# Patient Record
Sex: Female | Born: 1965 | Race: Black or African American | Hispanic: No | Marital: Single | Smoking: Never smoker
Health system: Southern US, Community
[De-identification: ages and names within clinical notes are randomized; demographics above are authoritative.]

## PROBLEM LIST (undated history)

## (undated) DIAGNOSIS — E119 Type 2 diabetes mellitus without complications: Secondary | ICD-10-CM

## (undated) DIAGNOSIS — K219 Gastro-esophageal reflux disease without esophagitis: Secondary | ICD-10-CM

## (undated) DIAGNOSIS — E010 Iodine-deficiency related diffuse (endemic) goiter: Secondary | ICD-10-CM

## (undated) DIAGNOSIS — J45909 Unspecified asthma, uncomplicated: Secondary | ICD-10-CM

## (undated) DIAGNOSIS — I10 Essential (primary) hypertension: Secondary | ICD-10-CM

## (undated) HISTORY — DX: Unspecified asthma, uncomplicated: J45.909

## (undated) HISTORY — PX: OTHER SURGICAL HISTORY: SHX169

---

## 2001-12-22 ENCOUNTER — Emergency Department (HOSPITAL_COMMUNITY): Admission: EM | Admit: 2001-12-22 | Discharge: 2001-12-22 | Payer: Self-pay | Admitting: Emergency Medicine

## 2008-06-13 ENCOUNTER — Emergency Department (HOSPITAL_COMMUNITY): Admission: EM | Admit: 2008-06-13 | Discharge: 2008-06-13 | Payer: Self-pay | Admitting: Emergency Medicine

## 2010-06-26 LAB — POCT CARDIAC MARKERS
Myoglobin, poc: 31 ng/mL (ref 12–200)
Myoglobin, poc: 34.6 ng/mL (ref 12–200)

## 2010-06-26 LAB — DIFFERENTIAL
Basophils Absolute: 0.1 10*3/uL (ref 0.0–0.1)
Basophils Relative: 1 % (ref 0–1)
Eosinophils Absolute: 0.1 10*3/uL (ref 0.0–0.7)
Monocytes Relative: 9 % (ref 3–12)
Neutrophils Relative %: 51 % (ref 43–77)

## 2010-06-26 LAB — CBC
MCHC: 33.8 g/dL (ref 30.0–36.0)
MCV: 86.7 fL (ref 78.0–100.0)
Platelets: UNDETERMINED 10*3/uL (ref 150–400)
RBC: 4.21 MIL/uL (ref 3.87–5.11)
RDW: 13.8 % (ref 11.5–15.5)

## 2010-06-26 LAB — BASIC METABOLIC PANEL
BUN: 9 mg/dL (ref 6–23)
CO2: 26 mEq/L (ref 19–32)
Chloride: 102 mEq/L (ref 96–112)
Creatinine, Ser: 0.72 mg/dL (ref 0.4–1.2)
Glucose, Bld: 104 mg/dL — ABNORMAL HIGH (ref 70–99)

## 2013-08-30 ENCOUNTER — Ambulatory Visit: Payer: Self-pay | Admitting: Podiatry

## 2014-02-14 ENCOUNTER — Ambulatory Visit (INDEPENDENT_AMBULATORY_CARE_PROVIDER_SITE_OTHER): Payer: Federal, State, Local not specified - PPO | Admitting: Podiatry

## 2014-02-14 ENCOUNTER — Encounter: Payer: Self-pay | Admitting: Podiatry

## 2014-02-14 VITALS — BP 165/101 | HR 60 | Ht 69.0 in | Wt 200.0 lb

## 2014-02-14 DIAGNOSIS — M7741 Metatarsalgia, right foot: Secondary | ICD-10-CM

## 2014-02-14 DIAGNOSIS — M7742 Metatarsalgia, left foot: Secondary | ICD-10-CM

## 2014-02-14 DIAGNOSIS — M21969 Unspecified acquired deformity of unspecified lower leg: Secondary | ICD-10-CM | POA: Insufficient documentation

## 2014-02-14 DIAGNOSIS — M216X1 Other acquired deformities of right foot: Secondary | ICD-10-CM

## 2014-02-14 DIAGNOSIS — M216X2 Other acquired deformities of left foot: Secondary | ICD-10-CM

## 2014-02-14 NOTE — Patient Instructions (Signed)
Casted for orthotics. Will contact patient when they are ready. 

## 2014-02-14 NOTE — Progress Notes (Signed)
Subjective:  Last orthotics were done 4 years ago. Padding got thin and uncomfortable at work.  Walks on concrete floor. Stated that she noted her feet walking on side of foot.  Pain under 5th MPJ R>L.  Objective: Cavus type foot with rearfoot varus bilateral. Sub 5 right foot pain. Neurovascular status are within normal.  Assessment: Metatarsalgia 5th right. Pes Cavus bilateral. Rearfoot varus bilateral. Elevated first ray left/forefoot varus left.  Plan: Both feet casted for orthotics.

## 2014-05-07 ENCOUNTER — Encounter (HOSPITAL_COMMUNITY): Payer: Self-pay | Admitting: Family Medicine

## 2014-05-07 ENCOUNTER — Emergency Department (HOSPITAL_COMMUNITY)
Admission: EM | Admit: 2014-05-07 | Discharge: 2014-05-07 | Disposition: A | Discharge status: Home / Self Care | Payer: Worker's Compensation | Attending: Emergency Medicine | Admitting: Emergency Medicine

## 2014-05-07 DIAGNOSIS — I10 Essential (primary) hypertension: Secondary | ICD-10-CM | POA: Insufficient documentation

## 2014-05-07 DIAGNOSIS — Z79899 Other long term (current) drug therapy: Secondary | ICD-10-CM | POA: Insufficient documentation

## 2014-05-07 DIAGNOSIS — G44229 Chronic tension-type headache, not intractable: Secondary | ICD-10-CM | POA: Insufficient documentation

## 2014-05-07 DIAGNOSIS — Z7951 Long term (current) use of inhaled steroids: Secondary | ICD-10-CM | POA: Insufficient documentation

## 2014-05-07 HISTORY — DX: Essential (primary) hypertension: I10

## 2014-05-07 MED ORDER — HYDROCODONE-ACETAMINOPHEN 5-325 MG PO TABS: 2.0000 | ORAL_TABLET | ORAL | Status: DC | PRN

## 2014-05-07 MED ORDER — SUMATRIPTAN SUCCINATE 100 MG PO TABS: 100.0000 mg | ORAL_TABLET | ORAL | Status: DC | PRN

## 2014-05-07 NOTE — Discharge Instructions (Signed)

## 2014-05-07 NOTE — ED Notes (Signed)
Pt having left sided headaches since this am. sts had a nerve burnt in her head a few years back.

## 2014-05-07 NOTE — ED Provider Notes (Signed)
CSN: [id number]     Arrival date & time 05/07/14  1015 History   First MD Initiated Contact with Patient 05/07/14 1318     Chief Complaint  Patient presents with  . Headache     (Consider location/radiation/quality/duration/timing/severity/associated sxs/prior Treatment) Patient is a 49 y.o. female presenting with headaches. The history is provided by the patient. No language interpreter was used.  Headache Pain location:  Occipital Quality:  Sharp Radiates to:  Does not radiate Severity currently:  10/10 Severity at highest:  10/10 Onset quality:  Sudden Duration:  1 day Timing:  Constant Progression:  Worsening Chronicity:  New Similar to prior headaches: yes   Relieved by:  Nothing Worsened by:  Nothing Ineffective treatments:  None tried Pt reports she had a rfa (nerve ablation) 5 years ago for headaches.  Pt reports she started having same pain today.   Pt has tried her MD  And has been unable to get appointment.  Past Medical History  Diagnosis Date  . Hypertension    History reviewed. No pertinent past surgical history. History reviewed. No pertinent family history. History  Substance Use Topics  . Smoking status: Never Smoker   . Smokeless tobacco: Never Used  . Alcohol Use: Not on file   OB History    No data available     Review of Systems  Neurological: Positive for headaches.  All other systems reviewed and are negative.     Allergies  Sulfa antibiotics and Mefenamic acid  Home Medications   Prior to Admission medications   Medication Sig Start Date End Date Taking? Authorizing Provider  FLUARIX QUADRIVALENT 0.5 ML injection  01/27/14  Yes Historical Provider, MD  fluticasone (FLONASE) 50 MCG/ACT nasal spray  02/01/14  Yes Historical Provider, MD  triamterene-hydrochlorothiazide (DYAZIDE) 37.5-25 MG per capsule Take 1 capsule by mouth daily. 01/26/14  Yes Historical Provider, MD   BP 157/97 mmHg  Pulse 71  Temp(Src) 98.3 F (36.8 C) (Oral)   Resp 18  Wt 180 lb (81.647 kg)  SpO2 100% Physical Exam  Constitutional: She appears well-developed.  HENT:  Head: Normocephalic and atraumatic.  Right Ear: External ear normal.  Left Ear: External ear normal.  Nose: Nose normal.  Mouth/Throat: Oropharynx is clear and moist.  Eyes: EOM are normal. Pupils are equal, round, and reactive to light.  Neck: Normal range of motion. Neck supple.  Cardiovascular: Normal rate.   Pulmonary/Chest: Effort normal.  Abdominal: Soft.  Musculoskeletal: Normal range of motion.  Neurological: She is alert.  Skin: Skin is warm.  Nursing note and vitals reviewed.   ED Course  Procedures (including critical care time) Labs Review Labs Reviewed - No data to display  Imaging Review No results found.   EKG Interpretation None      MDM   Final diagnoses:  Chronic tension-type headache, not intractable   I called pt's MD's Dr. Fernanda Drumroundtree and Dr. Park BreedKahn.   I left message requesting call back.  No one returned my call.   I left message requesting pt be seen this week.   Hydrocodone imitrex    Lonia SkinnerLeslie K Sioux RapidsSofia, [place] JerseyPA-C 05/07/14 1448  Gwyneth SproutWhitney Plunkett, MD 05/07/14 [phone]

## 2015-01-02 ENCOUNTER — Ambulatory Visit: Payer: Federal, State, Local not specified - PPO | Admitting: Podiatry

## 2015-01-02 ENCOUNTER — Encounter: Payer: Self-pay | Admitting: Podiatry

## 2015-01-02 ENCOUNTER — Ambulatory Visit (INDEPENDENT_AMBULATORY_CARE_PROVIDER_SITE_OTHER): Payer: Federal, State, Local not specified - PPO | Admitting: Podiatry

## 2015-01-02 VITALS — BP 160/91 | HR 62

## 2015-01-02 DIAGNOSIS — M7741 Metatarsalgia, right foot: Secondary | ICD-10-CM | POA: Diagnosis not present

## 2015-01-02 DIAGNOSIS — M7742 Metatarsalgia, left foot: Secondary | ICD-10-CM

## 2015-01-02 DIAGNOSIS — M216X1 Other acquired deformities of right foot: Secondary | ICD-10-CM | POA: Diagnosis not present

## 2015-01-02 DIAGNOSIS — M216X2 Other acquired deformities of left foot: Secondary | ICD-10-CM | POA: Diagnosis not present

## 2015-01-02 NOTE — Patient Instructions (Signed)
Orthotics adjusted. If they help will put permanent posting. Bring Orthotics to send back to Lab to mount permanent posting.

## 2015-01-02 NOTE — Progress Notes (Signed)
Subjective:  49 year old female presents stating that the lateral border and inferior lateral ankle area are painful.  This is the same problem as the last visit.  She feels the orthotics have not enough arch heights.  She had first orthotics about 5 years ago. The Last orthotics were dispensed in January 2016.  She walks on concrete floor working 12 hours a day. Stated that she noted her feet walking on side of foot.   Objective: Rigid Cavus type foot with rearfoot varus bilateral. Sub 5 right foot pain. Neurovascular status are within normal.  Assessment: Metatarsalgia 5th metatarsal base bilateral.  Pes Cavus bilateral. Rearfoot varus bilateral. Elevated first ray left/forefoot varus left.  Plan: Lateral posting added to heel and lateral column to evert the rearfoot.  Patient may try for a week or two and bring them back to add permanent posting if indicated.

## 2015-02-13 DIAGNOSIS — M722 Plantar fascial fibromatosis: Secondary | ICD-10-CM

## 2015-02-18 ENCOUNTER — Encounter: Payer: Self-pay | Admitting: Podiatry

## 2015-02-18 ENCOUNTER — Ambulatory Visit (INDEPENDENT_AMBULATORY_CARE_PROVIDER_SITE_OTHER): Payer: Federal, State, Local not specified - PPO | Admitting: Podiatry

## 2015-02-18 VITALS — BP 139/97 | HR 59

## 2015-02-18 DIAGNOSIS — M216X9 Other acquired deformities of unspecified foot: Secondary | ICD-10-CM

## 2015-02-18 DIAGNOSIS — M7740 Metatarsalgia, unspecified foot: Secondary | ICD-10-CM | POA: Diagnosis not present

## 2015-02-18 DIAGNOSIS — M7742 Metatarsalgia, left foot: Principal | ICD-10-CM

## 2015-02-18 DIAGNOSIS — M21969 Unspecified acquired deformity of unspecified lower leg: Secondary | ICD-10-CM

## 2015-02-18 DIAGNOSIS — M7741 Metatarsalgia, right foot: Secondary | ICD-10-CM

## 2015-02-18 DIAGNOSIS — M216X1 Other acquired deformities of right foot: Secondary | ICD-10-CM

## 2015-02-18 DIAGNOSIS — M216X2 Other acquired deformities of left foot: Secondary | ICD-10-CM

## 2015-02-18 NOTE — Patient Instructions (Signed)
Seen for pain in left foot. Adjusted orthotics failed to improve. Ankle brace dispensed for left foot. Do daily stretch exercise and take Aleve as needed.  Return as needed.

## 2015-02-18 NOTE — Progress Notes (Signed)
Subjective:  49 year old female presents stating that the lateral border and inferior lateral ankle area are painful, which was the initial chief complaints last December 2015. The first episode was in 2011, 5 years ago that was resolved with orthotic treatment.  In October 2016, her orthotics were adjusted with added lateral posting. State that the orthotics are useless at this time, her both feet hurt L>R.along the plantar lateral margin of left foot duration of couple of month and getting worse.  She works standing on feet for 10-12 hours a day.   Objective: Rigid Cavus type foot with rearfoot varus bilateral. Lateral column pain along plantar surface. Neurovascular status are within normal. Positive of tight Achilles tendon R>L. Elevated first ray L>R.  Previous x-ray shoe more elevation of the first ray L>R in lateral view with short first ray.   Assessment: Metatarsalgia 5th metatarsal base and lateral column at mid foot L>R.  Pes Cavus bilateral. Rearfoot varus bilateral. Elevated first ray left/forefoot varus left. Failed to improve with new and adjusted orthotics. Equinus deformity both feet.  Plan: Reviewed clinical findings and available treatment options. Discussed Achilles tendon stretch exercise. Discussed getting a new pair shoes. Ankle brace dispensed for the left foot to use while at work.  Return as needed.

## 2015-03-14 ENCOUNTER — Ambulatory Visit: Payer: Federal, State, Local not specified - PPO | Admitting: Podiatry

## 2015-03-19 ENCOUNTER — Encounter: Payer: Self-pay | Admitting: Pediatrics

## 2015-03-19 ENCOUNTER — Ambulatory Visit (INDEPENDENT_AMBULATORY_CARE_PROVIDER_SITE_OTHER): Payer: Federal, State, Local not specified - PPO | Admitting: Pediatrics

## 2015-03-19 VITALS — BP 146/90 | HR 64 | Temp 97.8°F | Resp 24 | Ht 68.5 in | Wt 200.8 lb

## 2015-03-19 DIAGNOSIS — J3089 Other allergic rhinitis: Secondary | ICD-10-CM | POA: Diagnosis not present

## 2015-03-19 DIAGNOSIS — R062 Wheezing: Secondary | ICD-10-CM | POA: Diagnosis not present

## 2015-03-19 MED ORDER — ALBUTEROL SULFATE HFA 108 (90 BASE) MCG/ACT IN AERS: 2.0000 | INHALATION_SPRAY | RESPIRATORY_TRACT | Status: DC | PRN

## 2015-03-19 MED ORDER — FLUTICASONE PROPIONATE 50 MCG/ACT NA SUSP: 2.0000 | Freq: Every day | NASAL | Status: DC

## 2015-03-19 MED ORDER — METHYLPREDNISOLONE ACETATE 40 MG/ML IJ SUSP
40.0000 mg | Freq: Once | INTRAMUSCULAR | Status: AC
  Administered 2015-03-19: 40 mg via INTRAMUSCULAR

## 2015-03-19 NOTE — Patient Instructions (Addendum)
Zyrtec 10 mg once a day for runny nose Fluticasone 2 sprays per nostril once a day for stuffy nose Proventil 2 puffs every 4 hours if needed for wheezing or coughing spells . Call me if you're not doing better on this treatment plan

## 2015-03-19 NOTE — Progress Notes (Signed)
  [address] [place] [address] Dept: [phone]  FOLLOW UP NOTE  Patient ID: Connie Zavala, female    DOB: [birth year]  Age: 50 y.o. MRN: [id number] Date of Office Visit: 03/19/2015  Assessment Chief Complaint: Cough and Wheezing  HPI Connie Zavala presents for evaluation of coughing spells for 3 days associated with some wheezing. She also has been having nasal congestion. She has not had a fever. She has had shortness of breath. She is allergic to dust mites   Current medications are fluticasone 2 sprays per nostril once a day. Her other medications are outlined in the chart   Drug Allergies:  Allergies  Allergen Reactions  . Shellfish-Derived Products   . Sulfa Antibiotics Hives  . Sumatriptan Succinate     "Triptans"  . Mefenamic Acid Rash    Physical Exam: BP 146/90 mmHg  Pulse 64  Temp(Src) 97.8 F (36.6 C) (Oral)  Resp 24  Ht 5' 8.5" (1.74 m)  Wt 200 lb 13.4 oz (91.1 kg)  BMI 30.09 kg/m2   Physical Exam  Constitutional: She is oriented to person, place, and time. She appears well-developed and well-nourished.  HENT:  Eyes normal. Ears normal. Nose mild swelling of nasal turbinates. Pharynx normal.  Neck: Neck supple.  Cardiovascular:  S1 and S2 normal no murmurs  Pulmonary/Chest:  Clear to percussion and auscultation  Lymphadenopathy:    She has no cervical adenopathy.  Neurological: She is alert and oriented to person, place, and time.  Psychiatric: She has a normal mood and affect. Her behavior is normal. Judgment and thought content normal.  Vitals reviewed.   Diagnostics:  FVC 3.16 L FEV1 2.58 L. Predicted FVC 3.47 L predicted FEV1 2.78 L-. After albuterol 2 puffs FVC 2.99 L FEV1 2.47 L-spirometry in the normal range and no significant improvement after albuterol  Assessment and Plan: 1. Wheezing   2. Other allergic rhinitis     Meds ordered this encounter  Medications  . albuterol (PROVENTIL HFA) 108 (90 Base) MCG/ACT inhaler   Sig: Inhale 2 puffs into the lungs every 4 (four) hours as needed for wheezing or shortness of breath.    Dispense:  1 Inhaler    Refill:  1  . fluticasone (FLONASE) 50 MCG/ACT nasal spray    Sig: Place 2 sprays into both nostrils daily.    Dispense:  16 g    Refill:  5    For stuffy nose or drainage  . methylPREDNISolone acetate (DEPO-MEDROL) injection 40 mg    Sig:     Patient Instructions  Zyrtec 10 mg once a day for runny nose Fluticasone 2 sprays per nostril once a day for stuffy nose Proventil 2 puffs every 4 hours if needed for wheezing or coughing spells . Call me if you're not doing better on this treatment plan    Return in about 6 months (around 09/16/2015).    Thank you for the opportunity to care for this patient.  Please do not hesitate to contact me with questions.  Tonette BihariJ. A. Kieth Hartis, M.D.  [facility] [place] [address] [place], [place] [address] [phone]

## 2015-07-09 ENCOUNTER — Ambulatory Visit: Attending: Orthopaedic Surgery | Admitting: Physical Therapy

## 2015-07-09 DIAGNOSIS — M25522 Pain in left elbow: Secondary | ICD-10-CM | POA: Diagnosis present

## 2015-07-09 DIAGNOSIS — M25521 Pain in right elbow: Secondary | ICD-10-CM | POA: Diagnosis present

## 2015-07-09 DIAGNOSIS — M6281 Muscle weakness (generalized): Secondary | ICD-10-CM | POA: Insufficient documentation

## 2015-07-09 NOTE — Patient Instructions (Signed)
Arm Curl    Sit or stand with feet shoulder width apart, arms straight down at sides, palms forward. Inhale, then exhale while slowly curling weights toward shoulders and keeping elbows touching torso. Slowly return to starting position. Repeat _10-15___ times per set. Do _1___ sets per session. Do __5-7__ sessions per week. May be done with dumbbells, or soup cans. Copyright  VHI. All rights reserved.    Forearm Supination / Pronation: Resisted    With __1__ pound object in right hand, slowly turn palm up, then down. Repeat __10__ times per set. Do __1__ sets per session. Do _1-2___ sessions per day.  Copyright  VHI. All rights reserved.   Extension: Wrist    Right forearm on thigh, wrist extending off knee, palm down, curl wrist up. Repeat __10__ times per set. Do 1__ sets per session. Do __5-7__ sessions per week. Use _1___ lb weight.  Copyright  VHI. All rights reserved.   Flexion (Resistive)    With hand palm-up and holding _1___ pound, bend hand toward you at wrist. Hold __1-2__ seconds. Relax slowly. Repeat _10___ times. Do _1-2___ sessions per day.  Copyright  VHI. All rights reserved.    Grip Strengthening (Isometric)    Squeeze washcloth roll. Hold __5__ seconds. Relax _5___ seconds. Repeat __10__ times. Do __1-2__ sessions per day.  Copyright  VHI. All rights reserved.

## 2015-07-09 NOTE — Therapy (Signed)
[facility] [place] [address]  [address] [place], [place], [address] Phone: [phone]   Fax:  [phone]  Physical Therapy Evaluation  Patient Details  Name: Connie Zavala MRN: [id number] Date of Birth: [birth year] Referring Provider: Dr. Doneen Poissonhristopher Blackman  Encounter Date: 07/09/2015      PT End of Session - 07/09/15 1312    Visit Number 1   Number of Visits 12   Date for PT Re-Evaluation 08/20/15   Authorization Type Workers Comp - 12 visits authorized   Authorization - Visit Number 1   Authorization - Number of Visits 12   PT Start Time 1030   PT Stop Time 1110   PT Time Calculation (min) 40 min   Activity Tolerance Patient tolerated treatment well   Behavior During Therapy [facility] for tasks assessed/performed      Past Medical History  Diagnosis Date  . Hypertension     No past surgical history on file.  There were no vitals filed for this visit.       Subjective Assessment - 07/09/15 1033    Subjective Pt is a 50 y/o female who presents to OPPT for bil medial elbow pain and epicondlyitis.  Pt states symptoms began over 1 year ago while working.  Pt stated she has been receiving injections for past year on avg every 3 months. (last injection 05/22/15).     Pertinent History HTN, seasonal allergies   Patient Stated Goals return to physical activities (bowling, golfing)   Currently in Pain? No/denies  currently no pain since injection   Pain Score 0-No pain  increases to 10 as injections wear off   Pain Location Arm  forearm   Pain Orientation Distal   Pain Descriptors / Indicators Aching;Throbbing   Pain Type Chronic pain   Pain Onset More than a month ago   Pain Frequency Intermittent   Aggravating Factors  extension of elbow and wrist repetitive motion   Pain Relieving Factors injections            [facility] PT Assessment - 07/09/15 1038    Assessment   Medical Diagnosis bil medial epicondylitis   Referring Provider Dr. Doneen Poissonhristopher Blackman   Onset Date/Surgical Date --  ~ 1 year ago   Hand Dominance Right   Next MD Visit 08/19/15   Prior Therapy none   Precautions   Precautions None   Restrictions   Weight Bearing Restrictions No   Balance Screen   Has the patient fallen in the past 6 months No   Has the patient had a decrease in activity level because of a fear of falling?  No   Is the patient reluctant to leave their home because of a fear of falling?  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 managernvironment   Living Environment Private residence   Prior Function   Level of Independence Independent  has pain with ADLs when pain returns   Vocation Full time employment   MetallurgistVocation Requirements equipment operator; drives forklift   Leisure bowling, golfing, get outside   Cognition   Overall Cognitive Status Within Functional Limits for tasks assessed   Observation/Other Assessments   Focus on Therapeutic Outcomes (FOTO)  52 (48% limited; predicted 43% limited)   Posture/Postural Control   Posture/Postural Control Postural limitations   Postural Limitations Rounded Shoulders;Forward head   AROM   Overall AROM Comments elbow and wrist WNL without pain   AROM Assessment Site --   Strength   Overall Strength Comments  pt states pain would be present with all testing if no recent injection given   Strength Assessment Site Elbow;Forearm;Hand;Wrist   Right/Left Elbow Right;Left   Right Elbow Flexion 4/5   Right Elbow Extension 3+/5   Left Elbow Flexion 4/5   Left Elbow Extension 3+/5   Right/Left Forearm Right;Left   Right Forearm Pronation 4/5   Right Forearm Supination 4/5   Left Forearm Pronation 4/5   Left Forearm Supination 4/5   Right Wrist Flexion 3+/5   Right Wrist Extension 4+/5   Left Wrist Flexion 3+/5   Left Wrist Extension 4+/5   Right Hand Grip (lbs) 44.67#  36, 48, 50   Left Hand Grip (lbs) 30#  35, 30, 25   Palpation   Palpation comment tenderness along medial epicondlye bil with  increased pain along ulnar tunnel                   [facility] Adult PT Treatment/Exercise - 07/09/15 1038    Exercises   Exercises Elbow;Wrist;Hand   Elbow Exercises   Elbow Flexion Both;10 reps;Seated   Bar Weights/Barbell (Elbow Flexion) 1 lb   Forearm Supination Both;10 reps;Seated   Forearm Supination Limitations 1#   Forearm Pronation Both;10 reps;Seated   Forearm Pronation Limitations 1#   Wrist Flexion Both;10 reps;Seated   Bar Weights/Barbell (Wrist Flexion) 1 lb   Wrist Extension Both;10 reps;Seated   Bar Weights/Barbell (Wrist Extension) 1 lb   Hand Exercises   Other Hand Exercises towel squeeze 10x5 sec                PT Education - 07/09/15 1309    Education provided Yes   Education Details clinical findings; POC, goals of care, HEP   Person(s) Educated Patient   Methods Explanation;Demonstration;Handout   Comprehension Verbalized understanding;Returned demonstration;Need further instruction             PT Long Term Goals - 07/09/15 1316    PT LONG TERM GOAL #1   Title independent with HEP (08/20/15)   PT LONG TERM GOAL #2   Title improve elbow and wrist strength to at least 4+/5 for improved function (08/20/15)   PT LONG TERM GOAL #3   Title verbalize understanding of work modifications to decrease risk of reinjury (08/20/15)               Plan - 07/09/15 1312    Clinical Impression Statement Pt is a 50 y/o female who presents to OPPT for low complexity evaluation of bil medial elbow pain consistent with overuse injury. Clinical findings limited at pt with recent injections and therefore no pain with most testing.  Pt did demonstrate some strength deficits and tenderness with palpation.  Pt will benefit from PT to address deficits and educate on activity/work modifications to decrease risk of reinjury. Of note; pt reports she has tried bracing and elbow strap without relief.   Rehab Potential Good   PT Frequency 2x / week   PT Duration 6  weeks   PT Treatment/Interventions ADLs/Self Care Home Management;Cryotherapy;Electrical Stimulation;Iontophoresis /ml Dexamethasone;Moist Heat;Therapeutic exercise;Therapeutic activities;Functional mobility training;Ultrasound;Patient/family education;Manual techniques;Taping;Passive range of motion   PT Next Visit Plan review HEP; progress exercises and education of work simulated activities, ionto and modalities PRN for pain   Consulted and Agree with Plan of Care Patient      Patient will benefit from skilled therapeutic intervention in order to improve the following deficits and impairments:  Pain, Decreased strength, Improper body mechanics  Visit Diagnosis: Pain  in left elbow - Plan: PT plan of care cert/re-cert  Pain in right elbow - Plan: PT plan of care cert/re-cert  Muscle weakness (generalized) - Plan: PT plan of care cert/re-cert     Problem List Patient Active Problem List   Diagnosis Date Noted  . Wheezing 03/19/2015  . Other allergic rhinitis 03/19/2015  . Equinus deformity of foot, acquired 02/18/2015  . Metatarsalgia of both feet 02/14/2014  . Metatarsal deformity 02/14/2014  . Acquired bilateral pes cavus 02/14/2014   Clarita Crane, PT, DPT 07/09/2015 1:20 PM  [facility] [address]  [address] [place], [place], [address] Phone: [phone]   Fax:  [phone]  Name: Connie Zavala MRN: [id number] Date of Birth: January 27, 1966

## 2015-07-12 ENCOUNTER — Ambulatory Visit: Attending: Orthopaedic Surgery

## 2015-07-12 DIAGNOSIS — M6281 Muscle weakness (generalized): Secondary | ICD-10-CM | POA: Insufficient documentation

## 2015-07-12 DIAGNOSIS — M25522 Pain in left elbow: Secondary | ICD-10-CM | POA: Diagnosis present

## 2015-07-12 DIAGNOSIS — M25521 Pain in right elbow: Secondary | ICD-10-CM | POA: Diagnosis present

## 2015-07-12 NOTE — Therapy (Signed)
[facility] [place] [address]  [address] [place], [place], [address] Phone: [phone]   Fax:  [phone]  Physical Therapy Treatment  Patient Details  Name: Connie Zavala MRN: [id number] Date of Birth: [birth year] Referring Provider: Dr. Doneen Poissonhristopher Blackman  Encounter Date: 07/12/2015      PT End of Session - 07/12/15 0825    Visit Number 2   Number of Visits 12   Date for PT Re-Evaluation 08/20/15   Authorization Type Workers Comp - 12 visits authorized   Authorization - Visit Number 2   Authorization - Number of Visits 12   PT Start Time 0804   PT Stop Time 0845   PT Time Calculation (min) 41 min   Activity Tolerance Patient tolerated treatment well   Behavior During Therapy [facility] for tasks assessed/performed      Past Medical History  Diagnosis Date  . Hypertension     History reviewed. No pertinent past surgical history.  There were no vitals filed for this visit.      Subjective Assessment - 07/12/15 0820    Subjective Pt. reports no medial forearm pain currently however reminded therapy that she has had an injection in both forearms on 3/8.     Patient Stated Goals return to physical activities (bowling, golfing)   Currently in Pain? No/denies   Pain Score 0-No pain   Multiple Pain Sites No       Today's Treatment:  Therex: UBE: 2.0, 90"/90" B prayer stretch x 30 sec  B forearm extension stretch x 30 sec each  B forearm flexion stretch x 30 sec each B Forearm flexion with 1# dumbbell 2 x 15 reps B Forearm extension  with 1# dumbbell 2 x 15 reps B Forearm pronation with 1# dumbbell 2 x 15 reps B Forearm supination with 1# dumbbell 2 x 15 reps  B forearm grip squeeze with rolled up towel in each hand 2 x 20 reps  B grip strength squeezer on easiest ped 2 x 15 reps  B supination / pronation with cane choked up 1/3 from end to enhance leverage x 15 each way each arm     Modalities: B ice pack to  forearm in seated with B UE elevated on table x 10"  Iontophoresis: 80 mA -min, L medial forearm, 4-6 hrs, patch #1/6, 1mL         PT Long Term Goals - 07/12/15 [id number]    PT LONG TERM GOAL #1   Title independent with HEP (08/20/15)   Status On-going   PT LONG TERM GOAL #2   Title improve elbow and wrist strength to at least 4+/5 for improved function (08/20/15)   Status On-going   PT LONG TERM GOAL #3   Title verbalize understanding of work modifications to decrease risk of reinjury (08/20/15)   Status On-going               Plan - 07/12/15 [id number]    Clinical Impression Statement Pt. tolerated all light B forearm strengthening well today with no pain reported at B medial elbow.  Pt. reports she recieved an injection in both forearms 3/8 and has been pain free since then.  Ice pack applied to B elbows following therex with iontophoresis (patch #1/6) applied to L medial elbow due to pt. reporting tightness in medial elbow / forearm.    PT Treatment/Interventions ADLs/Self Care Home Management;Cryotherapy;Electrical Stimulation;Iontophoresis 4mg /ml Dexamethasone;Moist Heat;Therapeutic exercise;Therapeutic activities;Functional mobility training;Ultrasound;Patient/family education;Manual techniques;Taping;Passive range of  motion   PT Next Visit Plan Review HEP; progress exercises and education of work simulated activities, ionto and modalities PRN for pain      Patient will benefit from skilled therapeutic intervention in order to improve the following deficits and impairments:  Pain, Decreased strength, Improper body mechanics  Visit Diagnosis: Pain in left elbow  Pain in right elbow  Muscle weakness (generalized)     Problem List Patient Active Problem List   Diagnosis Date Noted  . Wheezing 03/19/2015  . Other allergic rhinitis 03/19/2015  . Equinus deformity of foot, acquired 02/18/2015  . Metatarsalgia of both feet 02/14/2014  . Metatarsal deformity 02/14/2014  .  Acquired bilateral pes cavus 02/14/2014    Kermit Balo, PTA 07/12/2015, 12:34 PM  [facility] [address]  [address] [place], [place], [address] Phone: [phone]   Fax:  [phone]  Name: Connie Zavala MRN: [id number] Date of Birth: 1966/02/24

## 2015-07-16 ENCOUNTER — Ambulatory Visit: Attending: Orthopaedic Surgery

## 2015-07-16 DIAGNOSIS — M6281 Muscle weakness (generalized): Secondary | ICD-10-CM | POA: Diagnosis present

## 2015-07-16 DIAGNOSIS — M25521 Pain in right elbow: Secondary | ICD-10-CM | POA: Insufficient documentation

## 2015-07-16 DIAGNOSIS — M25522 Pain in left elbow: Secondary | ICD-10-CM | POA: Insufficient documentation

## 2015-07-16 NOTE — Therapy (Signed)
[facility] [place] [address]  [address] [place], [place], [address] Phone: [phone]   Fax:  [phone]  Physical Therapy Treatment  Patient Details  Name: Connie MedianJerrie L Cuadras MRN: [id number] Date of Birth: [birth year] Referring Provider: Dr. Doneen Poissonhristopher Blackman  Encounter Date: 07/16/2015      PT End of Session - 07/16/15 0824    Visit Number 3   Number of Visits 12   Date for PT Re-Evaluation 08/20/15   Authorization Type Workers Comp - 12 visits authorized   Authorization - Visit Number 3   Authorization - Number of Visits 12   PT Start Time 0815  Treatment started late due to pt. arriving late to PT.   PT Stop Time [phone]   PT Time Calculation (min) 27 min   Activity Tolerance Patient tolerated treatment well   Behavior During Therapy [facility] for tasks assessed/performed      Past Medical History  Diagnosis Date  . Hypertension     History reviewed. No pertinent past surgical history.  There were no vitals filed for this visit.      Subjective Assessment - 07/16/15 0818    Subjective Pt. reports a 5/10 pain on L medial elbow currently.  Pt. reports she removed the iontophoresis patch following last treatment within the first hour after having an allergic reaction with itching and hives around patch.     Currently in Pain? Yes   Pain Score 5    Pain Location Elbow   Pain Orientation Left;Medial   Pain Descriptors / Indicators Aching;Throbbing   Pain Type Chronic pain   Pain Onset More than a month ago   Pain Frequency Intermittent   Multiple Pain Sites No      Today's Treatment:  Therex: UBE: 1.5, 90"/90" B prayer stretch x 30 sec  B forearm extension stretch x 30 sec each  B forearm flexion stretch x 30 sec each B Forearm flexion with 1# dumbbell 2 x 15 reps; second set with B 2# dumbbells B Forearm extension with 1# dumbbell 2 x 15 reps; second set with B 2# dumbbells B Forearm pronation with 1# dumbbell  2 x 15 reps; second set with B 2# dumbbells B Forearm supination with 1# dumbbell 2 x 15 reps; second set with B 2# dumbbells  B forearm grip squeeze with rolled up towel in each hand 2 x 20 reps  B grip strength squeezer (green 15 peg) 2 x 15 reps  B supination / pronation with cane choked up 1/3 from end to enhance leverage x 15 each way each arm   Modalities: B ice pack to forearm in seated with B UE elevated on table x 10"        PT Long Term Goals - 07/12/15 [id number]    PT LONG TERM GOAL #1   Title independent with HEP (08/20/15)   Status On-going   PT LONG TERM GOAL #2   Title improve elbow and wrist strength to at least 4+/5 for improved function (08/20/15)   Status On-going   PT LONG TERM GOAL #3   Title verbalize understanding of work modifications to decrease risk of reinjury (08/20/15)   Status On-going               Plan - 07/16/15 [id number]    Clinical Impression Statement Pt. reports she had an allergic reaction to the iontophoresis patch following last treatment with itching and hives around the patch at work; dexamethason added to  list of known allergies.  Pt. tolerated all forearm strengthening activity well today with advacement to 2# dumbells.  Pt. able to recall all HEP activities from memory and reported they are all easy with the 1lb can; pt. advised to increase to a 2# can with these activities.     PT Treatment/Interventions ADLs/Self Care Home Management;Cryotherapy;Electrical Stimulation;Iontophoresis /ml Dexamethasone;Moist Heat;Therapeutic exercise;Therapeutic activities;Functional mobility training;Ultrasound;Patient/family education;Manual techniques;Taping;Passive range of motion   PT Next Visit Plan Dexamethasone patch NOT to be used again due to allergic reaction; progress exercises and education of work simulated activities, and modalities PRN for pain      Patient will benefit from skilled therapeutic intervention in order to improve the following  deficits and impairments:  Pain, Decreased strength, Improper body mechanics  Visit Diagnosis: Pain in left elbow  Pain in right elbow  Muscle weakness (generalized)     Problem List Patient Active Problem List   Diagnosis Date Noted  . Wheezing 03/19/2015  . Other allergic rhinitis 03/19/2015  . Equinus deformity of foot, acquired 02/18/2015  . Metatarsalgia of both feet 02/14/2014  . Metatarsal deformity 02/14/2014  . Acquired bilateral pes cavus 02/14/2014    Kermit Balo, PTA 07/16/2015, 8:54 AM  [facility] [address]  [address] [place], [place], [address] Phone: [phone]   Fax:  [phone]  Name: CHASEY DULL MRN: [id number] Date of Birth: Mar 27, 1965

## 2015-07-18 ENCOUNTER — Ambulatory Visit

## 2015-07-18 DIAGNOSIS — M6281 Muscle weakness (generalized): Secondary | ICD-10-CM

## 2015-07-18 DIAGNOSIS — M25522 Pain in left elbow: Secondary | ICD-10-CM

## 2015-07-18 DIAGNOSIS — M25521 Pain in right elbow: Secondary | ICD-10-CM

## 2015-07-18 NOTE — Therapy (Signed)
[facility] [place] [address]  [address] [place], [place], [address] Phone: [phone]   Fax:  [phone]  Physical Therapy Treatment  Patient Details  Name: Connie MedianJerrie L Dangler MRN: [id number] Date of Birth: 1966/01/18 Referring Provider: Dr. Doneen Poissonhristopher Blackman  Encounter Date: 07/18/2015      PT End of Session - 07/18/15 1118    Visit Number 4   Number of Visits 12   Date for PT Re-Evaluation 08/20/15   Authorization Type Workers Comp - 12 visits authorized   Authorization - Visit Number 4   Authorization - Number of Visits 12   PT Start Time 1105   PT Stop Time 1145   PT Time Calculation (min) 40 min   Activity Tolerance Patient tolerated treatment well   Behavior During Therapy [facility] for tasks assessed/performed      Past Medical History  Diagnosis Date  . Hypertension     History reviewed. No pertinent past surgical history.  There were no vitals filed for this visit.      Subjective Assessment - 07/18/15 1114    Subjective Pt. reports she is on a lighter duty task at work currently however doesn't know how long that will last.    Patient Stated Goals return to physical activities (bowling, golfing)   Currently in Pain? Yes   Pain Score 6    Pain Location Elbow   Pain Orientation Left;Medial   Pain Descriptors / Indicators Aching;Throbbing   Pain Type Chronic pain   Pain Onset More than a month ago   Multiple Pain Sites Yes   Pain Score 5   Pain Location Elbow   Pain Orientation Right   Pain Descriptors / Indicators Aching   Pain Type Acute pain      Today's Treatment:  Therex: UBE 3 min forward / 3 min backward, level 2.5 B prayer stretch x 30 sec  B forearm extension stretch x 30 sec each  B forearm flexion stretch x 30 sec each B Forearm flexion with 2# dumbbell x 15 reps B Forearm extension with 2# dumbbell x 15 reps B Forearm pronation with cane (1/4 of cane) x 15 reps  B Forearm supination  with cane (1/4 of cane)  x 15 reps  B supination / pronation with cane choked up 1/4 from end to enhance leverage x 15 each way each arm  B prayer stretch x 30 sec  B forearm extension stretch x 30 sec each  Modalities: B ice pack to forearm in seated with B UE elevated on table x 10"        PT Long Term Goals - 07/12/15 [id number]    PT LONG TERM GOAL #1   Title independent with HEP (08/20/15)   Status On-going   PT LONG TERM GOAL #2   Title improve elbow and wrist strength to at least 4+/5 for improved function (08/20/15)   Status On-going   PT LONG TERM GOAL #3   Title verbalize understanding of work modifications to decrease risk of reinjury (08/20/15)   Status On-going               Plan - 07/18/15 1120    Clinical Impression Statement Pt. tolerated all B forearm strengthening activity well today however with mild pain with end range ulnar deviation strengthening with 3# dumbbell; resistance adjusted with all activities to meet pt. tolerance; pt. continues to dislike ice on B forearms however pt. instructed on benefits of ice and agreed  to continue.     PT Treatment/Interventions ADLs/Self Care Home Management;Cryotherapy;Electrical Stimulation;Iontophoresis /ml Dexamethasone;Moist Heat;Therapeutic exercise;Therapeutic activities;Functional mobility training;Ultrasound;Patient/family education;Manual techniques;Taping;Passive range of motion   PT Next Visit Plan Dexamethasone patch NOT to be used again due to allergic reaction; progress exercises and education of work simulated activities, and modalities PRN for pain      Patient will benefit from skilled therapeutic intervention in order to improve the following deficits and impairments:  Pain, Decreased strength, Improper body mechanics  Visit Diagnosis: Pain in left elbow  Pain in right elbow  Muscle weakness (generalized)     Problem List Patient Active Problem List   Diagnosis Date Noted  . Wheezing  03/19/2015  . Other allergic rhinitis 03/19/2015  . Equinus deformity of foot, acquired 02/18/2015  . Metatarsalgia of both feet 02/14/2014  . Metatarsal deformity 02/14/2014  . Acquired bilateral pes cavus 02/14/2014    Kermit Balo, PTA 07/18/2015, 11:56 AM  [facility] [address]  [address] [place], [place], [address] Phone: [phone]   Fax:  [phone]  Name: Connie Zavala MRN: [id number] Date of Birth: 07/28/1965

## 2015-07-23 ENCOUNTER — Ambulatory Visit

## 2015-07-26 ENCOUNTER — Ambulatory Visit: Admitting: Physical Therapy

## 2015-07-26 DIAGNOSIS — M25521 Pain in right elbow: Secondary | ICD-10-CM

## 2015-07-26 DIAGNOSIS — M25522 Pain in left elbow: Secondary | ICD-10-CM | POA: Diagnosis not present

## 2015-07-26 DIAGNOSIS — M6281 Muscle weakness (generalized): Secondary | ICD-10-CM

## 2015-07-26 NOTE — Therapy (Signed)
[facility] [address]  [address] [place], [place], [address] Phone: [phone]   Fax:  [phone]  Physical Therapy Treatment  Patient Details  Name: LIOR CARTELLI MRN: [id number] Date of Birth: [birth year] Referring Provider: Dr. Doneen Poisson  Encounter Date: 07/26/2015      PT End of Session - 07/26/15 0806    Visit Number 5   Number of Visits 12   Date for PT Re-Evaluation 08/20/15   Authorization Type Workers Comp - 12 visits authorized   Authorization - Visit Number 5   Authorization - Number of Visits 12   PT Start Time 0800   PT Stop Time 0842   PT Time Calculation (min) 42 min   Activity Tolerance Patient tolerated treatment well   Behavior During Therapy [facility] for tasks assessed/performed      Past Medical History  Diagnosis Date  . Hypertension     No past surgical history on file.  There were no vitals filed for this visit.      Subjective Assessment - 07/26/15 0805    Subjective Pt states "the pain is still there", nothing seems to change it.   Patient Stated Goals return to physical activities (bowling, golfing)   Currently in Pain? Yes   Pain Score 6    Pain Location Elbow   Pain Orientation Left;Medial   Pain Descriptors / Indicators Aching;Throbbing   Pain Score 0   Pain Location Elbow   Pain Orientation Right            Today's Treatment  TherEx UBE - lvl 2.5, 3 min fwd/back  Modalities [place] - 1 MHz, DC 20%, 5 cm head, 1.0 W/cm2 x8'  TherEx Ulnar nerve glide x15 Ulnar nerve flossing x15 B forearm prayer stretch x 30 sec  B forearm extension stretch x 30 sec each  B forearm flexion stretch x 30 sec each B Wrist flexion with 2# db x15 B Wrist extension with 2# db x15  B Forearm pronation/supination with 2# db x15   Kinesiotape 2 "I" strips (30-50% - Radial wrist to distal tricep over medial condyle & Ulnar wrist to distal tricep over medial  condyle           PT Long Term Goals - 07/26/15 0914    PT LONG TERM GOAL #1   Title independent with HEP (08/20/15)   Status On-going   PT LONG TERM GOAL #2   Title improve elbow and wrist strength to at least 4+/5 for improved function (08/20/15)   Status On-going   PT LONG TERM GOAL #3   Title verbalize understanding of work modifications to decrease risk of reinjury (08/20/15)   Status On-going               Plan - 07/26/15 0845    Clinical Impression Statement L elbow pain continues to remain elevated/worsen as pt reports she is not able to rest it much at work, 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 Pt does not like ice and does not follow through with icing at home, therefore initiated a trial of [place] to promote increased circulation prior to exercises followed by trial of kinesiotape for L medial epicondylitis and will assess response at next visit.   PT Treatment/Interventions ADLs/Self Care Home Management;Cryotherapy;Electrical Stimulation;Iontophoresis /ml Dexamethasone;Moist Heat;Therapeutic exercise;Therapeutic activities;Functional mobility training;Ultrasound;Patient/family education;Manual techniques;Taping;Passive range of motion   PT Next Visit Plan Assess response to taping; Dexamethasone patch NOT to  be used again due to allergic reaction; progress exercises and education of work simulated activities, and modalities PRN for pain   Consulted and Agree with Plan of Care Patient      Patient will benefit from skilled therapeutic intervention in order to improve the following deficits and impairments:  Pain, Decreased strength, Improper body mechanics  Visit Diagnosis: Pain in left elbow  Pain in right elbow  Muscle weakness (generalized)     Problem List Patient Active Problem List   Diagnosis Date Noted  . Wheezing 03/19/2015  . Other allergic rhinitis 03/19/2015  . Equinus deformity of foot, acquired 02/18/2015  .  Metatarsalgia of both feet 02/14/2014  . Metatarsal deformity 02/14/2014  . Acquired bilateral pes cavus 02/14/2014    Marry GuanJoAnne M Sajjad Honea, PT, MPT 07/26/2015, 9:15 AM  [facility] [place] [address]  [address] [place], [place], [address] Phone: [phone]   Fax:  [phone]  Name: Letta MedianJerrie L Trumpower MRN: [id number] Date of Birth: 1965-04-16

## 2015-07-29 ENCOUNTER — Ambulatory Visit

## 2015-07-29 DIAGNOSIS — M6281 Muscle weakness (generalized): Secondary | ICD-10-CM

## 2015-07-29 DIAGNOSIS — M25522 Pain in left elbow: Secondary | ICD-10-CM | POA: Diagnosis not present

## 2015-07-29 DIAGNOSIS — M25521 Pain in right elbow: Secondary | ICD-10-CM

## 2015-07-29 NOTE — Therapy (Signed)
[facility] [address]  [address] [place], [place], [address] Phone: [phone]   Fax:  [phone]  Physical Therapy Treatment  Patient Details  Name: Connie Zavala MRN: [id number] Date of Birth: 12-13-1965 Referring Provider: Dr. Doneen Poisson  Encounter Date: 07/29/2015      PT End of Session - 07/29/15 0816    Visit Number 6   Number of Visits 12   Date for PT Re-Evaluation 08/20/15   Authorization Type Workers Comp - 12 visits authorized   Authorization - Visit Number 6   Authorization - Number of Visits 12   PT Start Time 0804   PT Stop Time 0854   PT Time Calculation (min) 50 min   Activity Tolerance Patient tolerated treatment well   Behavior During Therapy [facility] for tasks assessed/performed      Past Medical History  Diagnosis Date  . Hypertension     History reviewed. No pertinent past surgical history.  There were no vitals filed for this visit.      Subjective Assessment - 07/29/15 0810    Subjective Pt. reports feeling onset of L elbow pain following work Friday evening; pt. reports she missed work on Saturday due to L forearm/elbow pain.  Pt. reports 9/10 throbbing L elbow currently.     Currently in Pain? Yes   Pain Score 9    Pain Location Elbow   Pain Orientation Left;Medial   Pain Descriptors / Indicators Aching;Throbbing   Pain Type Chronic pain   Pain Onset More than a month ago   Pain Frequency Intermittent   Multiple Pain Sites No       Today's Treatment  TherEx UBE - lvl 1.5, 2 min fwd/back  Modalities: IFC: 80-150Hz , L proximal forearm along flexor group, intensity to tolerance, 15 min (therex performed with IFC current)   Therex: Dynamic ulnar nerve glide x15 Ulnar nerve flossing x15 L forearm prayer stretch x 30 sec  L forearm extension stretch x 30 sec each  L forearm flexion stretch x 30 sec each L wrist radial deviation with 2# dumbbell  X 15 reps L Wrist  flexion with 2# db x 3; terminated due to pt. report of increased L forearm pain  L Wrist extension with 2# db x15  L forearm grip squeeze (easiest peg) 2 x 15 reps L Forearm pronation/supination with 2# db x 3; terminated due to pt. report of increased L forearm pain  R Wrist flexion with 2# db x 15; terminated due to pt. report of increased L forearm pain  R Wrist extensionwith 2# db x15  R forearm grip squeeze (easiest peg) x 15 reps R Forearm pronation/supination with 2# db x 3; terminated due to pt. report of increased L forearm pain   Modalities: IFC: 80-150Hz , L proximal forearm along flexor group, intensity to tolerance, 10' Ice pack to L forearm, 10', 3 pillowcases over pack * pt. reporting 0/10 L forearm pain following ice / E-stim       PT Long Term Goals - 07/26/15 4401    PT LONG TERM GOAL #1   Title independent with HEP (08/20/15)   Status On-going   PT LONG TERM GOAL #2   Title improve elbow and wrist strength to at least 4+/5 for improved function (08/20/15)   Status On-going   PT LONG TERM GOAL #3   Title verbalize understanding of work modifications to decrease risk of reinjury (08/20/15)   Status On-going  Plan - 07/29/15 0817    Clinical Impression Statement Pt. 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 is currently leaving work to attend PT and returning to work following PT appointment.  Pt. with limited tolerance for L wrist ulnar deviation and L wrist flexion strengthening activity today; IFC E-stim applied to L forearm for majority of therex and with ice pack following therex with pt. tolerating well.  Pt. reports no benefit from taping applied last treatment; taping not reapplied today to L forearm.  Pt. reported 0/10 L forearm pain following ice/E-stim combo; plan to educate pt. on home tens unit next treatment    PT Treatment/Interventions ADLs/Self Care Home  Management;Cryotherapy;Electrical Stimulation;Iontophoresis 4mg /ml Dexamethasone;Moist Heat;Therapeutic exercise;Therapeutic activities;Functional mobility training;Ultrasound;Patient/family education;Manual techniques;Taping;Passive range of motion   PT Next Visit Plan Education on home TENS unit; Dexamethasone patch NOT to be used again due to allergic reaction; progress exercises and education of work simulated activities, and modalities PRN for pain      Patient will benefit from skilled therapeutic intervention in order to improve the following deficits and impairments:  Pain, Decreased strength, Improper body mechanics  Visit Diagnosis: Pain in left elbow  Pain in right elbow  Muscle weakness (generalized)     Problem List Patient Active Problem List   Diagnosis Date Noted  . Wheezing 03/19/2015  . Other allergic rhinitis 03/19/2015  . Equinus deformity of foot, acquired 02/18/2015  . Metatarsalgia of both feet 02/14/2014  . Metatarsal deformity 02/14/2014  . Acquired bilateral pes cavus 02/14/2014    Kermit BaloMicah Germaine Shenker, PTA 07/29/2015, 9:08 AM  [facility] [place] [address]  [address] [place], [place], [address] Phone: [phone]   Fax:  [phone]  Name: Letta MedianJerrie L Zavala MRN: [id number] Date of Birth: March 24, 1965

## 2015-08-01 ENCOUNTER — Ambulatory Visit

## 2015-08-01 DIAGNOSIS — M25522 Pain in left elbow: Secondary | ICD-10-CM

## 2015-08-01 DIAGNOSIS — M6281 Muscle weakness (generalized): Secondary | ICD-10-CM

## 2015-08-01 DIAGNOSIS — M25521 Pain in right elbow: Secondary | ICD-10-CM

## 2015-08-01 NOTE — Therapy (Signed)
[facility] [place] [address]  [address] [place], [place], [address] Phone: [phone]   Fax:  [phone]  Physical Therapy Treatment  Patient Details  Name: Connie MedianJerrie L Deramo MRN: [id number] Date of Birth: April 30, 1965 Referring Provider: Dr. Doneen Poissonhristopher Blackman  Encounter Date: 08/01/2015      PT End of Session - 08/01/15 0815    Visit Number 7   Number of Visits 12   Date for PT Re-Evaluation 08/20/15   Authorization Type Workers Comp - 12 visits authorized   Authorization - Visit Number 7   Authorization - Number of Visits 12   PT Start Time 0802   PT Stop Time 0843   PT Time Calculation (min) 41 min   Activity Tolerance Patient tolerated treatment well   Behavior During Therapy [facility] for tasks assessed/performed      Past Medical History  Diagnosis Date  . Hypertension     No past surgical history on file.  There were no vitals filed for this visit.      Subjective Assessment - 08/01/15 0804    Subjective Pt. reports 7/10 L elbow pain initially today reporting that the pain is unchanged from last treatment.    Patient Stated Goals return to physical activities (bowling, golfing)   Currently in Pain? Yes   Pain Score 7    Pain Location Elbow   Pain Orientation Left;Medial   Pain Descriptors / Indicators Aching;Throbbing   Pain Type Chronic pain   Pain Onset More than a month ago   Pain Frequency Intermittent   Multiple Pain Sites No      Today's Treatment  TherEx UBE - lvl 2.0, 2.5 min fwd/back  Therex: Dynamic ulnar nerve glide x15 Ulnar nerve flossing x15 B forearm prayer stretch x 30 sec  B forearm extension stretch x 30 sec each  B forearm flexion stretch x 30 sec each L wrist radial deviation with 1# dumbbell X 15 reps; R 2# x 15 reps L Wrist flexion with 2# db x 15;  L Wrist extension with 2# db x15  L forearm grip squeeze (easiest peg) 2 x 15 reps; R grip squeeze (85/15 peg) x 15 reps L  Forearm pronation/supination with 1# db x 15 B wrist extension / flexion with red band in door x 15 reps   Modalities: IFC: 80-150Hz , L proximal forearm along flexor group, intensity to tolerance, 10' Ice pack to L forearm, 10', 2 pillowcases over pack  * pt. reporting 0/10 L forearm pain following ice / E-stim        PT Education - 08/01/15 0811    Education provided Yes   Education Details TENS Unit education    Person(s) Educated Patient   Methods Handout   Comprehension Verbal cues required;Need further instruction;Verbalized understanding             PT Long Term Goals - 07/26/15 0914    PT LONG TERM GOAL #1   Title independent with HEP (08/20/15)   Status On-going   PT LONG TERM GOAL #2   Title improve elbow and wrist strength to at least 4+/5 for improved function (08/20/15)   Status On-going   PT LONG TERM GOAL #3   Title verbalize understanding of work modifications to decrease risk of reinjury (08/20/15)   Status On-going               Plan - 08/01/15 0816    Clinical Impression Statement Pt. reported 7/10 L forearm /  elbow pain initially however able to perform resisted forearm activities throughout therex without additional reported pain from initial.  Pt. educated on home TENS Unit today with handout; pt. instructed that insurance compensation for this is rare.  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     PT Treatment/Interventions ADLs/Self Care Home Management;Cryotherapy;Electrical Stimulation;Iontophoresis /ml Dexamethasone;Moist Heat;Therapeutic exercise;Therapeutic activities;Functional mobility training;Ultrasound;Patient/family education;Manual techniques;Taping;Passive range of motion   PT Next Visit Plan Education on home TENS unit; Dexamethasone patch NOT to be used again due to allergic reaction; progress exercises and education of work simulated activities, and modalities PRN for pain      Patient will  benefit from skilled therapeutic intervention in order to improve the following deficits and impairments:  Pain, Decreased strength, Improper body mechanics  Visit Diagnosis: Pain in left elbow  Pain in right elbow  Muscle weakness (generalized)     Problem List Patient Active Problem List   Diagnosis Date Noted  . Wheezing 03/19/2015  . Other allergic rhinitis 03/19/2015  . Equinus deformity of foot, acquired 02/18/2015  . Metatarsalgia of both feet 02/14/2014  . Metatarsal deformity 02/14/2014  . Acquired bilateral pes cavus 02/14/2014    Kermit Balo, PTA 08/01/2015, 8:56 AM  [facility] [address]  [address] [place], [place], [address] Phone: [phone]   Fax:  [phone]  Name: Connie Zavala MRN: [id number] Date of Birth: [birth year]

## 2015-08-01 NOTE — Patient Instructions (Signed)
TENS UNIT: This is helpful for muscle pain and spasm.   Search and Purchase a TENS 7000 2nd edition at www.tenspros.com. It should be less than $30.     TENS unit instructions: Do not shower or bathe with the unit on  Turn the unit off before removing electrodes or batteries  If the electrodes lose stickiness add a drop of water to the electrodes after they are disconnected from the unit and place on plastic sheet. If you continued to have difficulty, call the TENS unit company to purchase more electrodes.  Do not apply lotion on the skin area prior to use. Make sure the skin is clean and dry as this will help prolong the life of the electrodes.  After use, always check skin for unusual red areas, rash or other skin difficulties. If there are any skin problems, does not apply electrodes to the same area.  Never remove the electrodes from the unit by pulling the wires.  Do not use the TENS unit or electrodes other than as directed.  Do not change electrode placement without consulting your therapist or physician.  Keep 2 fingers with between each electrode. 

## 2015-08-05 ENCOUNTER — Ambulatory Visit: Admitting: Physical Therapy

## 2015-08-05 DIAGNOSIS — M25522 Pain in left elbow: Secondary | ICD-10-CM

## 2015-08-05 DIAGNOSIS — M6281 Muscle weakness (generalized): Secondary | ICD-10-CM

## 2015-08-05 DIAGNOSIS — M25521 Pain in right elbow: Secondary | ICD-10-CM

## 2015-08-05 NOTE — Therapy (Signed)
[facility] [address]  [address] [place], [place], [address] Phone: [phone]   Fax:  [phone]  Physical Therapy Treatment  Patient Details  Name: Connie Zavala MRN: [id number] Date of Birth: [birth year] Referring Provider: Dr. Doneen Poisson  Encounter Date: 08/05/2015      PT End of Session - 08/05/15 0810    Visit Number 8   Number of Visits 12   Date for PT Re-Evaluation 08/20/15   Authorization Type Workers Comp - 12 visits authorized   Authorization - Visit Number 8   Authorization - Number of Visits 12   PT Start Time 0801   PT Stop Time 0850   PT Time Calculation (min) 49 min   Activity Tolerance Patient tolerated treatment well   Behavior During Therapy [facility] for tasks assessed/performed      Past Medical History  Diagnosis Date  . Hypertension     No past surgical history on file.  There were no vitals filed for this visit.      Subjective Assessment - 08/05/15 0808    Subjective Pt reporting benefit from estim & ice only lasting for ~30 minutes after therapy, and no other interventions seem to ive lasting relif of pain.   Currently in Pain? Yes   Pain Score 8    Pain Location Elbow   Pain Orientation Left;Medial   Pain Descriptors / Indicators Throbbing          Today's Treatment  Modalities Moist heat pack to medial elbow/proximal forearm x10'  Manual STM & gentle cross-friction massage to L flexor pronator muscles x5'  TherEx Dynamic ulnar nerve glide x15 Ulnar nerve flossing x15 B forearm prayer stretch x30" B forearm extension stretch in supination & pronation x30" each B forearm flexion stretch x30" B wrist extension (elbow @ 90 dg, forearm neutral) x15 B wrist extension (elbow @ 90 dg, forearm supinated) x15 B wrist extension (elbow extended, forearm neutral) x15 B wrist extension (elbow extended, forearm supinated) x15 L Wrist extensionwith 2# db  x15  Modalities IFC (80-150Hz ) to L proximal forearm along flexor group, intensity to pt tolerance, 10' Ice pack to L forearm, 10', 2 pillowcases over pack  * pt. reporting 0/10 L forearm pain following ice / E-stim          PT Long Term Goals - 08/05/15 0845    PT LONG TERM GOAL #1   Title independent with HEP (08/20/15)   Status On-going   PT LONG TERM GOAL #2   Title improve elbow and wrist strength to at least 4+/5 for improved function (08/20/15)   Status On-going   PT LONG TERM GOAL #3   Title verbalize understanding of work modifications to decrease risk of reinjury (08/20/15)   Status On-going               Plan - 08/05/15 0840    Clinical Impression Statement Pt continues to present to PT with high levels of pain, with only temporary relief lasting ~30 minutes acheived from therapy sessions as 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 Remained more conservative with therapy today focusing on STM and stretching with strenghening only of extensor group today to avoid increasing irritation in flexor pronator group.   PT Treatment/Interventions ADLs/Self Care Home Management;Cryotherapy;Electrical Stimulation;Iontophoresis /ml Dexamethasone;Moist Heat;Therapeutic exercise;Therapeutic activities;Functional mobility training;Ultrasound;Patient/family education;Manual techniques;Taping;Passive range of motion   PT Next Visit Plan Education on home TENS unit; Dexamethasone  patch NOT to be used again due to allergic reaction; progress exercises and education of work simulated activities, and modalities PRN for pain      Patient will benefit from skilled therapeutic intervention in order to improve the following deficits and impairments:  Pain, Decreased strength, Improper body mechanics  Visit Diagnosis: Pain in left elbow  Pain in right elbow  Muscle weakness (generalized)     Problem List Patient Active  Problem List   Diagnosis Date Noted  . Wheezing 03/19/2015  . Other allergic rhinitis 03/19/2015  . Equinus deformity of foot, acquired 02/18/2015  . Metatarsalgia of both feet 02/14/2014  . Metatarsal deformity 02/14/2014  . Acquired bilateral pes cavus 02/14/2014    Marry GuanJoAnne M Kreis, PT, MPT 08/05/2015, 8:47 AM  [facility] [address]  [address] [place], [place], [address] Phone: [phone]   Fax:  [phone]  Name: Connie Zavala MRN: [id number] Date of Birth: 05/09/1965

## 2015-08-08 ENCOUNTER — Ambulatory Visit

## 2015-08-08 DIAGNOSIS — M25521 Pain in right elbow: Secondary | ICD-10-CM

## 2015-08-08 DIAGNOSIS — M6281 Muscle weakness (generalized): Secondary | ICD-10-CM

## 2015-08-08 DIAGNOSIS — M25522 Pain in left elbow: Secondary | ICD-10-CM | POA: Diagnosis not present

## 2015-08-08 NOTE — Therapy (Signed)
[facility] [address]  [address] [place], [place], [address] Phone: [phone]   Fax:  [phone]  Physical Therapy Treatment  Patient Details  Name: Connie MedianJerrie L Andis MRN: [id number] Date of Birth: November 11, 1965 Referring Provider: Dr. Doneen Poissonhristopher Blackman  Encounter Date: 08/08/2015      PT End of Session - 08/08/15 0812    Visit Number 9   Number of Visits 12   Date for PT Re-Evaluation 08/20/15   Authorization Type Workers Comp - 12 visits authorized   Authorization - Visit Number 9   Authorization - Number of Visits 12   PT Start Time 0802   PT Stop Time 0842   PT Time Calculation (min) 40 min   Activity Tolerance Patient tolerated treatment well   Behavior [facility] for tasks assessed/performed      Past Medical History  Diagnosis Date  . Hypertension     History reviewed. No pertinent past surgical history.  There were no vitals filed for this visit.      Subjective Assessment - 08/08/15 0805    Subjective Pt. reporting L forearm pain remains the same as the last few weeks at a constant 8/10 throbbing pain.   Patient Stated Goals return to physical activities (bowling, golfing)   Currently in Pain? Yes   Pain Score 8    Pain Location Elbow   Pain Orientation Left;Medial   Pain Descriptors / Indicators Throbbing   Pain Type Chronic pain   Pain Onset More than a month ago   Pain Frequency Constant   Multiple Pain Sites No      Today's Treatment  Modalities: Moist heat pack to medial elbow/proximal forearm x5'  Manual: STM & gentle cross-friction massage to L flexor pronator muscles x5'  TherEx: Dynamic ulnar nerve glide x 10 reps Ulnar nerve flossing 3" hold x 10 reps  B forearm prayer stretch x30" B forearm extension stretch in supination & pronation x30" each B forearm flexion stretch x30" L forearm pronation / supination x 15 reps with cane (choked up 1/3)  Modalities: IFC  (80-150Hz ) to L proximal forearm along flexor group, intensity to pt tolerance, 10' Ice pack to L forearm, 10', 2 pillowcases over pack           PT Long Term Goals - 08/05/15 0845    PT LONG TERM GOAL #1   Title independent with HEP (08/20/15)   Status On-going   PT LONG TERM GOAL #2   Title improve elbow and wrist strength to at least 4+/5 for improved function (08/20/15)   Status On-going   PT LONG TERM GOAL #3   Title verbalize understanding of work modifications to decrease risk of reinjury (08/20/15)   Status On-going               Plan - 08/08/15 0844    Clinical Impression Statement Pt. reports her pain is the same as always this morning at an 8/10 throbbing pain along medial aspect of L elbow; pt. 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  Today's treatment with heat initially to L forearm with STM / cross-friction massage to medial forearm following this; ulnar nerve glides continued today with treatment ending with E-stim and ice combo.  Pt. continues to have very limited tolerance for resisted L forearm / wrist strengthening activity.  PT Treatment/Interventions ADLs/Self Care Home Management;Cryotherapy;Electrical Stimulation;Iontophoresis /ml Dexamethasone;Moist Heat;Therapeutic exercise;Therapeutic activities;Functional mobility training;Ultrasound;Patient/family education;Manual techniques;Taping;Passive range of motion   PT Next Visit Plan Dexamethasone patch NOT to be used again due to allergic reaction; progress exercises and education of work simulated activities, and modalities PRN for pain      Patient will benefit from skilled therapeutic intervention in order to improve the following deficits and impairments:  Pain, Decreased strength, Improper body mechanics  Visit Diagnosis: Pain in left elbow  Pain in right  elbow  Muscle weakness (generalized)     Problem List Patient Active Problem List   Diagnosis Date Noted  . Wheezing 03/19/2015  . Other allergic rhinitis 03/19/2015  . Equinus deformity of foot, acquired 02/18/2015  . Metatarsalgia of both feet 02/14/2014  . Metatarsal deformity 02/14/2014  . Acquired bilateral pes cavus 02/14/2014    Kermit Balo, PTA 08/08/2015, 9:01 AM  [facility] [address]  [address] [place], [place], [address] Phone: [phone]   Fax:  [phone]  Name: SUETTA HOFFMEISTER MRN: [id number] Date of Birth: Mar 12, 1966

## 2015-08-09 ENCOUNTER — Ambulatory Visit

## 2015-08-13 ENCOUNTER — Ambulatory Visit

## 2015-08-15 ENCOUNTER — Ambulatory Visit: Attending: Orthopaedic Surgery | Admitting: Physical Therapy

## 2015-08-15 DIAGNOSIS — M25521 Pain in right elbow: Secondary | ICD-10-CM | POA: Insufficient documentation

## 2015-08-15 DIAGNOSIS — M6281 Muscle weakness (generalized): Secondary | ICD-10-CM | POA: Insufficient documentation

## 2015-08-15 DIAGNOSIS — M25522 Pain in left elbow: Secondary | ICD-10-CM | POA: Insufficient documentation

## 2015-08-15 NOTE — Therapy (Addendum)
[facility] [address]  [place], [place], [address] Phone: [phone]   Fax:  [phone]  Physical Therapy Treatment  Patient Details  Name: Connie Zavala MRN: [id number] Date of Birth: [birth year] Referring Provider: Dr. Jean Rosenthal  Encounter Date: 08/15/2015      PT End of Session - 08/15/15 0811    Visit Number 10   Number of Visits 12   Date for PT Re-Evaluation 08/20/15   Authorization Type Workers Comp - 12 visits authorized   Authorization - Visit Number 10   Authorization - Number of Visits 12   PT Start Time 0803   PT Stop Time 0856   PT Time Calculation (min) 53 min   Activity Tolerance Patient tolerated treatment well   Behavior During Therapy [facility] for tasks assessed/performed      Past Medical History  Diagnosis Date  . Hypertension     No past surgical history on file.  There were no vitals filed for this visit.      Subjective Assessment - 08/15/15 0809    Subjective Pt reports L medial elbow/forearm pain remains constant at 8/10, R forearm pain is only intermittent but can be up to 6/10 when present.   Currently in Pain? Yes   Pain Score 8    Pain Location Elbow   Pain Orientation Left;Medial            [facility] PT Assessment - 08/15/15 0804    Assessment   Medical Diagnosis bil medial epicondylitis   Referring Provider Dr. Jean Rosenthal   Onset Date/Surgical Date --  ~ 1 year ago   Hand Dominance Right   Next MD Visit 08/19/15   AROM   Overall AROM Comments elbow and wrist WNL without pain   Strength   Overall Strength Comments no pain, just "pulling" when resistance applied   Strength Assessment Site Elbow;Forearm   Right Elbow Flexion 4+/5   Right Elbow Extension 4/5   Left Elbow Flexion 4/5   Left Elbow Extension 4-/5   Right Forearm Pronation 4+/5   Right Forearm Supination 4+/5   Left Forearm Pronation 4/5   Left Forearm Supination 4/5   Right  Wrist Flexion 4/5   Right Wrist Extension 4+/5   Left Wrist Flexion 4/5   Left Wrist Extension 4+/5   Right Hand Grip (lbs) 26#   Left Hand Grip (lbs) 20#         Today's Treatment  Modalities Moist heat pack to medial elbow/proximal forearm x5'  ROM/MMT assessment  Goal assessment  Manual STM & gentle cross-friction massage to L flexor pronator muscles x5'  TherEx Dynamic ulnar nerve glide x15 Ulnar nerve flossing x15 B forearm prayer stretch x30" B forearm extension stretch in supination & pronation x30" each B forearm flexion stretch x30" B wrist extension (elbow @ 90 dg, forearm neutral) x15 B wrist extension (elbow @ 90 dg, forearm supinated) x15 B wrist extension (elbow extended, forearm neutral) x15 B wrist extension (elbow extended, forearm supinated) x15 L Wrist extensionwith 2# db x15  Modalities IFC (80-'150Hz' ) to L proximal forearm along flexor group, intensity to pt tolerance, 10' Ice pack to L forearm, 10', 2 pillowcases over pack         PT Long Term Goals - 08/15/15 4356    PT LONG TERM GOAL #1   Title independent with HEP (08/20/15)   Status Achieved   PT LONG TERM GOAL #2   Title improve  elbow and wrist strength to at least 4+/5 for improved function (08/20/15)   Status Partially Met  Met for R elbow flexion, R pronation/supination & wrist extension   PT LONG TERM GOAL #3   Title verbalize understanding of work modifications to decrease risk of reinjury (08/20/15)   Status Partially Met  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                Plan - 08/15/15 1229    Clinical Impression Statement Progress with PT has been limited due pt unable to take sufficient break from same repetitive motion of L forearm while steering forklift at work, with only breaks occuring on rare shifts where  she works as a Librarian, academic. L UE pain remains near constant at 8/10 with only very brief relief from manual therapy, exercises/stretches and modalities during therapy sessions, with pt having adverse dermatological reaction to trial of ionto and no benefit noted from kinesiotaping. Distal UE strength has improved with wrists remaining symmetrical R vs L but L elbow and forearm 1/2 greade weakner than R on average.   PT Treatment/Interventions ADLs/Self Care Home Management;Cryotherapy;Electrical Stimulation;Iontophoresis 5m/ml Dexamethasone;Moist Heat;Therapeutic exercise;Therapeutic activities;Functional mobility training;Ultrasound;Patient/family education;Manual techniques;Taping;Passive range of motion   PT Next Visit Plan Dexamethasone patch NOT to be used again due to allergic reaction; progress exercises and education of work simulated activities, and modalities PRN for pain      Patient will benefit from skilled therapeutic intervention in order to improve the following deficits and impairments:  Pain, Decreased strength, Improper body mechanics  Visit Diagnosis: Pain in left elbow  Pain in right elbow  Muscle weakness (generalized)     Problem List Patient Active Problem List   Diagnosis Date Noted  . Wheezing 03/19/2015  . Other allergic rhinitis 03/19/2015  . Equinus deformity of foot, acquired 02/18/2015  . Metatarsalgia of both feet 02/14/2014  . Metatarsal deformity 02/14/2014  . Acquired bilateral pes cavus 02/14/2014    JPercival Spanish PT, MPT 08/15/2015, 12:43 PM  [facility] [place] [address]Phone: [phone]  Fax:  [phone] Name: Connie RABAGOMRN: [id number] of Birth: 513-Dec-1967  PHYSICAL THERAPY DISCHARGE SUMMARY  Visits from Start of Care: 10  Current functional level related to goals / functional outcomes: Continued pain 8/10. Patient continues to  work with repetitive use of UE which irritates symptoms. Patient was to return to MD and call to schedule as indicated. She will be discharged at this time   Remaining deficits: Continued pain and functional limitation    Education / Equipment: HEP   Plan: Patient agrees to discharge.  Patient goals were not met. Patient is being discharged due to not returning since the last visit.  ?????   Celyn P. HHelene KelpPT, MPH 09/12/2015 8:20 AM

## 2015-08-20 ENCOUNTER — Other Ambulatory Visit (HOSPITAL_COMMUNITY): Payer: Self-pay | Admitting: Physician Assistant

## 2015-08-21 ENCOUNTER — Other Ambulatory Visit (HOSPITAL_COMMUNITY): Payer: Self-pay | Admitting: Physician Assistant

## 2015-08-21 DIAGNOSIS — M25522 Pain in left elbow: Secondary | ICD-10-CM

## 2015-09-02 ENCOUNTER — Ambulatory Visit (HOSPITAL_COMMUNITY)
Admission: RE | Admit: 2015-09-02 | Discharge: 2015-09-02 | Disposition: A | Discharge status: Home / Self Care | Source: Ambulatory Visit | Attending: Physician Assistant | Admitting: Physician Assistant

## 2015-09-02 DIAGNOSIS — M25522 Pain in left elbow: Secondary | ICD-10-CM | POA: Insufficient documentation

## 2015-09-02 DIAGNOSIS — M25422 Effusion, left elbow: Secondary | ICD-10-CM | POA: Insufficient documentation

## 2015-09-02 DIAGNOSIS — M7702 Medial epicondylitis, left elbow: Secondary | ICD-10-CM | POA: Diagnosis not present

## 2016-04-02 ENCOUNTER — Encounter (HOSPITAL_BASED_OUTPATIENT_CLINIC_OR_DEPARTMENT_OTHER): Payer: Self-pay | Admitting: Emergency Medicine

## 2016-04-02 ENCOUNTER — Emergency Department (HOSPITAL_BASED_OUTPATIENT_CLINIC_OR_DEPARTMENT_OTHER)
Admission: EM | Admit: 2016-04-02 | Discharge: 2016-04-02 | Disposition: A | Discharge status: Home / Self Care | Payer: Federal, State, Local not specified - PPO | Attending: Emergency Medicine | Admitting: Emergency Medicine

## 2016-04-02 DIAGNOSIS — S3992XA Unspecified injury of lower back, initial encounter: Secondary | ICD-10-CM | POA: Diagnosis present

## 2016-04-02 DIAGNOSIS — Y929 Unspecified place or not applicable: Secondary | ICD-10-CM | POA: Diagnosis not present

## 2016-04-02 DIAGNOSIS — Z79899 Other long term (current) drug therapy: Secondary | ICD-10-CM | POA: Diagnosis not present

## 2016-04-02 DIAGNOSIS — S335XXA Sprain of ligaments of lumbar spine, initial encounter: Secondary | ICD-10-CM

## 2016-04-02 DIAGNOSIS — I10 Essential (primary) hypertension: Secondary | ICD-10-CM | POA: Insufficient documentation

## 2016-04-02 DIAGNOSIS — Y9389 Activity, other specified: Secondary | ICD-10-CM | POA: Diagnosis not present

## 2016-04-02 DIAGNOSIS — X501XXA Overexertion from prolonged static or awkward postures, initial encounter: Secondary | ICD-10-CM | POA: Insufficient documentation

## 2016-04-02 DIAGNOSIS — Y999 Unspecified external cause status: Secondary | ICD-10-CM | POA: Insufficient documentation

## 2016-04-02 MED ORDER — KETOROLAC TROMETHAMINE 30 MG/ML IJ SOLN
30.0000 mg | Freq: Once | INTRAMUSCULAR | Status: AC
  Administered 2016-04-02: 30 mg via INTRAMUSCULAR
  Filled 2016-04-02: qty 1

## 2016-04-02 MED ORDER — NAPROXEN 500 MG PO TABS: 500.0000 mg | ORAL_TABLET | Freq: Two times a day (BID) | ORAL | 0 refills | Status: DC

## 2016-04-02 MED ORDER — KETOROLAC TROMETHAMINE 30 MG/ML IJ SOLN: 30.0000 mg | Freq: Once | INTRAMUSCULAR | Status: DC

## 2016-04-02 MED FILL — NAPROXEN 500 MG TABLET: 500 | 15 days supply | Qty: 30 | Fill #0

## 2016-04-02 NOTE — ED Triage Notes (Signed)
Patient states that she pulled a muscle yesterday in her lower back. She went to the Emergency room at that time and was given muscle relaxant and heating pad. Patient states that it is not any better today so she came back here

## 2016-04-02 NOTE — ED Notes (Signed)
Pt given Rx x 1 for naproxen and note for work

## 2016-04-02 NOTE — ED Provider Notes (Signed)
[facility] Provider Note   CSN: [id number] Arrival date & time: 04/02/16  1615     History   Chief Complaint Chief Complaint  Patient presents with  . Back Pain    HPI Connie Zavala is a 51 y.o. female.  HPI 51 year old female who presents with right lower back pain. She has history of hypertension otherwise healthy. States that she was bending over to pull a large heavy tray out of the oven yesterday when she felt right-sided low back pain. Was seen initially at [facility] emergency department and diagnosed with back strain and sprain. she was discharged with muscle relaxants. She has been taking Tylenol, muscle relaxants, and using heat pads, without any improved pain. Has not had lower extremity numbness or weakness, urinary incontinence or retention, bowel incontinence. No abdominal pain, dysuria, urinary frequency or hematuria. Past Medical History:  Diagnosis Date  . Hypertension     Patient Active Problem List   Diagnosis Date Noted  . Wheezing 03/19/2015  . Other allergic rhinitis 03/19/2015  . Equinus deformity of foot, acquired 02/18/2015  . Metatarsalgia of both feet 02/14/2014  . Metatarsal deformity 02/14/2014  . Acquired bilateral pes cavus 02/14/2014    History reviewed. No pertinent surgical history.  OB History    No data available       Home Medications    Prior to Admission medications   Medication Sig Start Date End Date Taking? Authorizing Provider  albuterol (PROVENTIL HFA) 108 (90 Base) MCG/ACT inhaler Inhale 2 puffs into the lungs every 4 (four) hours as needed for wheezing or shortness of breath. 03/19/15   Fletcher Anon, MD  FLUARIX QUADRIVALENT 0.5 ML injection Reported on 03/19/2015 01/27/14   Historical Provider, MD  fluticasone Aleda Grana) 50 MCG/ACT nasal spray  02/01/14   Historical Provider, MD  fluticasone (FLONASE) 50 MCG/ACT nasal spray Place 2 sprays into both nostrils daily. 03/19/15   Fletcher Anon, MD    HYDROcodone-acetaminophen (NORCO/VICODIN) 5-325 MG per tablet Take 2 tablets by mouth every 4 (four) hours as needed. Patient not taking: Reported on 07/09/2015 05/07/14   Elson Areas, PA-C  Naproxen Sodium (ALEVE PO) Take by mouth.    Historical Provider, MD  ramipril (ALTACE) 5 MG capsule Take 10 mg by mouth daily.     Historical Provider, MD  SUMAtriptan (IMITREX) 100 MG tablet Take 1 tablet (100 mg total) by mouth every 2 (two) hours as needed for migraine or headache. May repeat in 2 hours if headache persists or recurs. Patient not taking: Reported on 07/09/2015 05/07/14   Elson Areas, PA-C  TRIAMTERENE PO Take by mouth. Reported on 07/09/2015    Historical Provider, MD  triamterene-hydrochlorothiazide (DYAZIDE) 37.5-25 MG per capsule Take 1 capsule by mouth daily. Reported on 07/09/2015 01/26/14   Historical Provider, MD    Family History History reviewed. No pertinent family history.  Social History Social History  Substance Use Topics  . Smoking status: Never Smoker  . Smokeless tobacco: Never Used  . Alcohol use 0.0 oz/week     Allergies   Shellfish-derived products; Sulfa antibiotics; Sumatriptan succinate; Dexamethasone sodium phosphate; and Mefenamic acid   Review of Systems Review of Systems  Constitutional: Negative for fever.  Gastrointestinal: Negative for abdominal pain.  Genitourinary: Negative for difficulty urinating.  Musculoskeletal: Positive for back pain.  Neurological: Negative for weakness and numbness.  All other systems reviewed and are negative.    Physical Exam Updated Vital Signs BP 152/88 (BP Location:  Right Arm)   Pulse 68   Temp 98.8 F (37.1 C) (Oral)   Resp 18   Ht 5\' 9"  (1.753 m)   Wt 200 lb (90.7 kg)   SpO2 100%   BMI 29.53 kg/m   Physical Exam Physical Exam  Nursing note and vitals reviewed. Constitutional: Well developed, well nourished, non-toxic, and in no acute distress Head: Normocephalic and atraumatic.   Mouth/Throat: Oropharynx is clear and moist.  Neck: Normal range of motion. Neck supple.  Cardiovascular: Normal rate and regular rhythm.   Pulmonary/Chest: Effort normal and breath sounds normal.  Abdominal: Soft. There is no tenderness. There is no rebound and no guarding.  Musculoskeletal: Tender to palpation over right lower lumbar paraspinal muscles. No CVA tenderness.  Neurological: Alert, no facial droop, fluent speech, sensation to light touch intact in bilateral lower extremities, full strength hip flexion/extension/adduction/abduction, knee flexion/extension, ankle dorsiflexion/plantar flexion. Skin: Skin is warm and dry.  Psychiatric: Cooperative   ED Treatments / Results  Labs (all labs ordered are listed, but only abnormal results are displayed) Labs Reviewed - No data to display  EKG  EKG Interpretation None       Radiology No results found.  Procedures Procedures (including critical care time)  Medications Ordered in ED Medications  ketorolac (TORADOL) 30 MG/ML injection 30 mg (30 mg Intramuscular Given 04/02/16 1643)     Initial Impression / Assessment and Plan / ED Course  I have reviewed the triage vital signs and the nursing notes.  Pertinent labs & imaging results that were available during my care of the patient were reviewed by me and considered in my medical decision making (see chart for details).     Returns to ED for persistent pain after back strain yesterday. Well-appearing, with normal vital signs. Neurological exam intact. No concerning features to the exam or history that would suggest serious spine or spinal cord process. She has no contraindications to anti-inflammatory medications, encouraged using these for pain control in addition to other supportive care measures. Strict return and follow-up instructions reviewed. She expressed understanding of all discharge instructions and felt comfortable with the plan of care.   Final Clinical  Impressions(s) / ED Diagnoses   Final diagnoses:  Sprain of low back, initial encounter    New Prescriptions New Prescriptions   No medications on file     Lavera Guiseana Duo Liu, MD 04/02/16 1657

## 2016-04-02 NOTE — Discharge Instructions (Signed)
Continue anti-inflammatory medications, heat or ice packs. Avoid prolonged bedrest. This may take several weeks to fully resolved.  Please follow-up with your PCP next week for recheck.  Return for worsening symptoms, including inability to urinate, fever, inability to walk, escalating pain or any other symptoms concerning to you.

## 2016-04-02 NOTE — ED Notes (Signed)
ED Provider at bedside. 

## 2016-05-20 ENCOUNTER — Ambulatory Visit: Payer: Federal, State, Local not specified - PPO | Admitting: Podiatry

## 2016-05-28 ENCOUNTER — Ambulatory Visit (INDEPENDENT_AMBULATORY_CARE_PROVIDER_SITE_OTHER): Payer: Federal, State, Local not specified - PPO | Admitting: Podiatry

## 2016-05-28 ENCOUNTER — Ambulatory Visit: Payer: Federal, State, Local not specified - PPO | Admitting: Podiatry

## 2016-05-28 ENCOUNTER — Encounter: Payer: Self-pay | Admitting: Podiatry

## 2016-05-28 DIAGNOSIS — M216X2 Other acquired deformities of left foot: Secondary | ICD-10-CM | POA: Diagnosis not present

## 2016-05-28 DIAGNOSIS — M7741 Metatarsalgia, right foot: Secondary | ICD-10-CM | POA: Diagnosis not present

## 2016-05-28 DIAGNOSIS — M21969 Unspecified acquired deformity of unspecified lower leg: Secondary | ICD-10-CM

## 2016-05-28 DIAGNOSIS — M216X1 Other acquired deformities of right foot: Secondary | ICD-10-CM

## 2016-05-28 DIAGNOSIS — M7742 Metatarsalgia, left foot: Secondary | ICD-10-CM

## 2016-05-28 DIAGNOSIS — M205X2 Other deformities of toe(s) (acquired), left foot: Secondary | ICD-10-CM | POA: Diagnosis not present

## 2016-05-28 NOTE — Patient Instructions (Signed)
Both feet casted for Orthotics. Will call when they are ready.  

## 2016-05-28 NOTE — Progress Notes (Signed)
Subjective:  51 year old female presents complaining of pain in left great toe under the nail plate and also requesting for custom orthotics.   HPI: Pain in lateral border and inferior lateral ankle joint, since December 2015. The first episode was in 2011, 5 years ago that was resolved with orthotic treatment. In October 2016, her orthotics were adjusted with added lateral posting. Patient failed to improve and was treated with Ankle brace. Now the refurbished orthotics have worn out and in need of a new pair. She works standing on feet for 10-12 hours a day.   Objective: Pain in left great toe due to hallux limitus left foot. No open skin or erythema noted. Rigid Cavus type foot with rearfoot varus bilateral. Lateral column pain along plantar surface. Neurovascular status are within normal. Positive of tight Achilles tendon R>L. Elevated first ray L>R.   Reviewed previous x-ray that show more elevation of the first ray L>R in lateral view with short first ray.   Assessment: Pain at the nail base left great toe from Hallux limitus left. Pes Cavus bilateral. Rearfoot varus bilateral. Elevated first ray left/forefoot varus left. Equinus deformity both feet.  Plan: Reviewed clinical findings and available treatment options. Both feet casted for Orthotics.

## 2016-06-10 ENCOUNTER — Encounter (INDEPENDENT_AMBULATORY_CARE_PROVIDER_SITE_OTHER): Payer: Self-pay | Admitting: Physician Assistant

## 2016-06-10 ENCOUNTER — Ambulatory Visit (INDEPENDENT_AMBULATORY_CARE_PROVIDER_SITE_OTHER): Admitting: Physician Assistant

## 2016-06-10 DIAGNOSIS — M25522 Pain in left elbow: Secondary | ICD-10-CM

## 2016-06-10 DIAGNOSIS — M25521 Pain in right elbow: Secondary | ICD-10-CM

## 2016-06-10 DIAGNOSIS — M7702 Medial epicondylitis, left elbow: Principal | ICD-10-CM

## 2016-06-10 DIAGNOSIS — M7701 Medial epicondylitis, right elbow: Secondary | ICD-10-CM

## 2016-06-10 MED ORDER — LIDOCAINE HCL 1 % IJ SOLN
1.0000 mL | INTRAMUSCULAR | Status: AC | PRN
  Administered 2016-06-10: 1 mL

## 2016-06-10 MED ORDER — METHYLPREDNISOLONE ACETATE 40 MG/ML IJ SUSP
40.0000 mg | INTRAMUSCULAR | Status: AC | PRN
  Administered 2016-06-10: 40 mg

## 2016-06-10 NOTE — Progress Notes (Signed)
   Office Visit Note   Patient: Connie Zavala           Date of Birth: 06/08/1965           MRN: [id number] Visit Date: 06/10/2016              Requested by: No referring provider defined for this encounter. PCP: Pcp Not In System   Assessment & Plan: Visit Diagnoses:  1. Medial epicondylitis of both elbows     Plan: She will follow with us on an as-needed basis. Postal paperwork for job restrictions was filled out today  Follow-Up Instructions: Return if symptoms worsen or fail to improve.   Orders:  Orders Placed This Encounter  Procedures  . Hand/Upper Extremity Injection/Arthrocentesis  . Hand/Upper Extremity Injection/Arthrocentesis   No orders of the defined types were placed in this encounter.     Procedures: Hand/UE Inj Date/Time: 06/10/2016 5:48 PM Performed by: Kirtland BouchardLARK, GILBERT W Authorized by: Kirtland BouchardLARK, GILBERT W   Consent Given by:  Patient Indications:  Pain and therapeutic Condition: medial epicondylitis   Site:  R elbow Needle Size:  25 G Approach:  Medial Medications:  1 mL lidocaine 1 %; 40 mg methylPREDNISolone acetate 40 MG/ML Patient tolerance:  Patient tolerated the procedure well with no immediate complications Hand/UE Inj Date/Time: 06/10/2016 5:49 PM Performed by: Kirtland BouchardLARK, GILBERT W Authorized by: Kirtland BouchardLARK, GILBERT W   Consent Given by:  Patient Indications:  Pain and therapeutic Condition: medial epicondylitis   Site:  L elbow Needle Size:  25 G Approach:  Medial Medications:  1 mL lidocaine 1 %; 40 mg methylPREDNISolone acetate 40 MG/ML     Clinical Data: No additional findings.   Subjective: Chief Complaint  Patient presents with  . Right Elbow - Pain  . Left Elbow - Pain    [name] is here for bilateral elbow injections. She has had no new injuries. We have seen her for chronic right and left medial epicondylitis in the past injections helped in the past. She is requesting injections again today.    Review of  Systems   Objective: Vital Signs: There were no vitals taken for this visit.  Physical Exam  Ortho Exam Under necessary of the medial epicondyle region of both elbows. Flexion of the wrist against resistance causes pain at the medial elbows bilaterally. She has no rashes skin lesions ulcerations erythema about the elbows. Otherwise good range of motion of both elbows Specialty Comments:  No specialty comments available.  Imaging: No results found.   PMFS History: Patient Active Problem List   Diagnosis Date Noted  . Wheezing 03/19/2015  . Other allergic rhinitis 03/19/2015  . Equinus deformity of foot, acquired 02/18/2015  . Metatarsalgia of both feet 02/14/2014  . Metatarsal deformity 02/14/2014  . Acquired bilateral pes cavus 02/14/2014   Past Medical History:  Diagnosis Date  . Hypertension     No family history on file.  No past surgical history on file. Social History   Occupational History  . Not on file.   Social History Main Topics  . Smoking status: Never Smoker  . Smokeless tobacco: Never Used  . Alcohol use 0.0 oz/week  . Drug use: No  . Sexual activity: Not on file

## 2016-07-06 ENCOUNTER — Ambulatory Visit (INDEPENDENT_AMBULATORY_CARE_PROVIDER_SITE_OTHER): Payer: Federal, State, Local not specified - PPO | Admitting: Pediatrics

## 2016-07-06 ENCOUNTER — Encounter: Payer: Self-pay | Admitting: Pediatrics

## 2016-07-06 VITALS — BP 110/80 | HR 68 | Temp 98.1°F | Resp 20 | Ht 69.0 in | Wt 188.9 lb

## 2016-07-06 DIAGNOSIS — J301 Allergic rhinitis due to pollen: Secondary | ICD-10-CM | POA: Diagnosis not present

## 2016-07-06 DIAGNOSIS — J4530 Mild persistent asthma, uncomplicated: Secondary | ICD-10-CM | POA: Insufficient documentation

## 2016-07-06 DIAGNOSIS — Z8679 Personal history of other diseases of the circulatory system: Secondary | ICD-10-CM | POA: Insufficient documentation

## 2016-07-06 MED ORDER — ALBUTEROL SULFATE HFA 108 (90 BASE) MCG/ACT IN AERS: 2.0000 | INHALATION_SPRAY | RESPIRATORY_TRACT | 1 refills | Status: DC | PRN

## 2016-07-06 MED ORDER — FLUTICASONE PROPIONATE 50 MCG/ACT NA SUSP: NASAL | 5 refills | Status: DC

## 2016-07-06 MED ORDER — MONTELUKAST SODIUM 10 MG PO TABS: 10.0000 mg | ORAL_TABLET | Freq: Every day | ORAL | 5 refills | Status: DC

## 2016-07-06 NOTE — Progress Notes (Signed)
[address] [place] [address] Dept: [phone]  FOLLOW UP NOTE  Patient ID: Connie Zavala, female    DOB: 1965-05-05  Age: 51 y.o. MRN: [id number] Date of Office Visit: 07/06/2016  Assessment  Chief Complaint: Nasal Congestion (pt states when she used the nasal spray over the weekend she noticed it increased her blood pressure, made her feel like her head was being sqeezed by a pair of plyers.)  HPI Connie Zavala presents for evaluation of coughing spells and nasal congestion. Her blood pressure was 180/110 2 days ago. She did not go to work and just took it easy, and her blood pressure has improved She is concerned that maybe her allergy medications were causing her blood pressure to be elevated. She is on medications for control of her blood pressure. In the past she has been allergic to tree pollens, dust mites and some molds  Current medications are Zyrtec 10 mg once a day, fluticasone 2 sprays per nostril once a day if needed and Proventil 2 puffs every 4 hours if needed . Her other medications are outlined in the chart and include ramipril and triamterene HCTZ   Drug Allergies:  Allergies  Allergen Reactions  . Shellfish-Derived Products   . Sulfa Antibiotics Hives  . Sulfamethoxazole Other (See Comments)    Not noted  . Sumatriptan Succinate     "Triptans"  . Dexamethasone Sodium Phosphate Itching    Itching ~ 1 hr following application.  A red circle under medication pad following removal.    . Mefenamic Acid Rash    Physical Exam: BP 110/80 (BP Location: Left Arm, Patient Position: Sitting, Cuff Size: Normal)   Pulse 68   Temp 98.1 F (36.7 C) (Oral)   Resp 20   Ht  (1.753 m)   Wt 188 lb 15 oz (85.7 kg)   BMI 27.90 kg/m    Physical Exam  Constitutional: She is oriented to person, place, and time. She appears well-developed and well-nourished.  HENT:  Eyes showed mild erythema of the palpebral conjunctiva. Ears normal. Nose moderate swelling of  nasal turbinates with clear nasal discharge. Pharynx normal.  Neck: Neck supple. No thyromegaly present.  Cardiovascular:  S1 and S2 normal no murmurs  Pulmonary/Chest:  Clear to percussion and auscultation  Neurological: She is alert and oriented to person, place, and time.  Psychiatric: She has a normal mood and affect. Her behavior is normal. Judgment and thought content normal.  Vitals reviewed.   Diagnostics:  FVC 3.23 L FEV1 2.69 L. Predicted FVC 3.45 L predicted FEV1 2.76 L-. After albuterol 2 puffs FVC 3.12 L FEV1 2.63 L-the spirometry is in the normal range and there was no significant improvement after albuterol .  Assessment and Plan: 1. Mild persistent asthma without complication   2. Seasonal allergic rhinitis due to pollen   3. History of hypertension     Meds ordered this encounter  Medications  . fluticasone (FLONASE) 50 MCG/ACT nasal spray    Sig: 2 sprays per nostril once a day if needed    Dispense:  16 g    Refill:  5    For stuffy nose  . albuterol (PROVENTIL HFA) 108 (90 Base) MCG/ACT inhaler    Sig: Inhale 2 puffs into the lungs every 4 (four) hours as needed for wheezing or shortness of breath.    Dispense:  1 Inhaler    Refill:  1  . montelukast (SINGULAIR) 10 MG tablet    Sig:  Take 1 tablet (10 mg total) by mouth at bedtime.    Dispense:  34 tablet    Refill:  5    For cough or wheeze.    Patient Instructions  Zyrtec 10 mg once a day if needed for runny nose or itchy eyes Fluticasone 2 sprays per nostril once a day if needed for stuffy nose Zaditor 0.025%-one drop in each eye 3 times a day to prevent allergies Proventil 2 puffs every 4 hours if needed for wheezing or coughing spells Montelukast 10 mg-take 1 tablet once a day for coughing or wheezing I do not feel that her hypertension was due to her allergy medications Call me if you're not doing well on this treatment She may return to work this week   Return in about 3 months (around  10/05/2016), or if symptoms worsen or fail to improve.    Thank you for the opportunity to care for this patient.  Please do not hesitate to contact me with questions.  Tonette Bihari, M.D.  [facility] [address] [place], [place] [address] [phone]

## 2016-07-06 NOTE — Patient Instructions (Addendum)
Zyrtec 10 mg once a day if needed for runny nose or itchy eyes Fluticasone 2 sprays per nostril once a day if needed for stuffy nose Zaditor 0.025%-one drop in each eye 3 times a day to prevent allergies Proventil 2 puffs every 4 hours if needed for wheezing or coughing spells Montelukast 10 mg-take 1 tablet once a day for coughing or wheezing I do not feel that her hypertension was due to her allergy medications Call me if you're not doing well on this treatment She may return to work this week

## 2016-07-08 ENCOUNTER — Telehealth: Payer: Self-pay | Admitting: Allergy

## 2016-07-08 NOTE — Telephone Encounter (Signed)
LEFT MESSAGE FOR PATIENT THAT THE FMLA FOR READY.

## 2016-10-31 ENCOUNTER — Emergency Department (HOSPITAL_COMMUNITY)
Admission: EM | Admit: 2016-10-31 | Discharge: 2016-10-31 | Disposition: A | Discharge status: Home / Self Care | Payer: Federal, State, Local not specified - PPO | Attending: Emergency Medicine | Admitting: Emergency Medicine

## 2016-10-31 ENCOUNTER — Encounter (HOSPITAL_COMMUNITY): Payer: Self-pay | Admitting: *Deleted

## 2016-10-31 DIAGNOSIS — M5481 Occipital neuralgia: Secondary | ICD-10-CM | POA: Diagnosis not present

## 2016-10-31 DIAGNOSIS — G4485 Primary stabbing headache: Secondary | ICD-10-CM | POA: Diagnosis not present

## 2016-10-31 DIAGNOSIS — I10 Essential (primary) hypertension: Secondary | ICD-10-CM | POA: Insufficient documentation

## 2016-10-31 DIAGNOSIS — R51 Headache: Secondary | ICD-10-CM | POA: Diagnosis present

## 2016-10-31 DIAGNOSIS — Z79899 Other long term (current) drug therapy: Secondary | ICD-10-CM | POA: Insufficient documentation

## 2016-10-31 MED ORDER — IBUPROFEN 800 MG PO TABS: 800.0000 mg | ORAL_TABLET | Freq: Three times a day (TID) | ORAL | 0 refills | Status: DC | PRN

## 2016-10-31 MED ORDER — KETOROLAC TROMETHAMINE 30 MG/ML IJ SOLN
30.0000 mg | Freq: Once | INTRAMUSCULAR | Status: AC
  Administered 2016-10-31: 30 mg via INTRAVENOUS
  Filled 2016-10-31: qty 1

## 2016-10-31 MED ORDER — SODIUM CHLORIDE 0.9 % IV BOLUS (SEPSIS)
500.0000 mL | Freq: Once | INTRAVENOUS | Status: AC
  Administered 2016-10-31: 500 mL via INTRAVENOUS

## 2016-10-31 MED ORDER — TRAMADOL HCL 50 MG PO TABS: 50.0000 mg | ORAL_TABLET | Freq: Four times a day (QID) | ORAL | 0 refills | Status: DC | PRN

## 2016-10-31 MED ORDER — PROCHLORPERAZINE EDISYLATE 5 MG/ML IJ SOLN
10.0000 mg | Freq: Once | INTRAMUSCULAR | Status: AC
  Administered 2016-10-31: 10 mg via INTRAVENOUS
  Filled 2016-10-31: qty 2

## 2016-10-31 MED ORDER — DIPHENHYDRAMINE HCL 50 MG/ML IJ SOLN
25.0000 mg | Freq: Once | INTRAMUSCULAR | Status: AC
  Administered 2016-10-31: 25 mg via INTRAVENOUS
  Filled 2016-10-31: qty 1

## 2016-10-31 NOTE — ED Provider Notes (Signed)
[facility]-EMERGENCY DEPT Provider Note   CSN: [id number] Arrival date & time: 10/31/16  1059     History   Chief Complaint Chief Complaint  Patient presents with  . Headache    HPI Connie Zavala is a 51 y.o. female.  HPI Patient presents to the emergency department with chronic headache.  The patient states that she had a work-related injury that caused her to have neuralgia that cause headache.  The patient states that certain positions make the headache worse.  Patient states that she has had injections in this area previously that it helped.  She states she has called her pain doctor at [facility] who will see her but not totally and September the patient states that she is looking for some current relief. The patient denies chest pain, shortness of breath, blurred vision,  fever, cough, weakness, numbness, dizziness, anorexia, edema, abdominal pain, nausea, vomiting, diarrhea, rash, back pain, dysuria, hematemesis, bloody stool, near syncope, or syncope. Past Medical History:  Diagnosis Date  . Hypertension     Patient Active Problem List   Diagnosis Date Noted  . Seasonal allergic rhinitis due to pollen 07/06/2016  . Mild persistent asthma without complication 07/06/2016  . History of hypertension 07/06/2016  . Wheezing 03/19/2015  . Other allergic rhinitis 03/19/2015  . Equinus deformity of foot, acquired 02/18/2015  . Metatarsalgia of both feet 02/14/2014  . Metatarsal deformity 02/14/2014  . Acquired bilateral pes cavus 02/14/2014    Past Surgical History:  Procedure Laterality Date  . RFA procedure      OB History    No data available       Home Medications    Prior to Admission medications   Medication Sig Start Date End Date Taking? Authorizing Provider  acetaminophen (TYLENOL) 500 MG tablet Take 500 mg by mouth. 04/01/16   [provider]  albuterol (PROVENTIL HFA) 108 (90 Base) MCG/ACT inhaler Inhale 2 puffs into the lungs every 4 (four) hours as needed  for wheezing or shortness of breath. 03/19/15   Fletcher Anon, MD  albuterol (PROVENTIL HFA) 108 (90 Base) MCG/ACT inhaler Inhale 2 puffs into the lungs every 4 (four) hours as needed for wheezing or shortness of breath. 07/06/16   Fletcher Anon, MD  fluticasone Aleda Grana) 50 MCG/ACT nasal spray  02/01/14   [provider]  fluticasone (FLONASE) 50 MCG/ACT nasal spray 2 sprays per nostril once a day if needed 07/06/16   Fletcher Anon, MD  lidocaine (XYLOCAINE) 5 % ointment Apply topically. 04/01/16 04/01/17  [provider]  montelukast (SINGULAIR) 10 MG tablet Take 1 tablet (10 mg total) by mouth at bedtime. 07/06/16   Fletcher Anon, MD  naproxen (NAPROSYN) 500 MG tablet Take 1 tablet (500 mg total) by mouth 2 (two) times daily. 04/02/16   Lavera Guise, MD  ramipril (ALTACE) 5 MG capsule Take 10 mg by mouth.    [provider]  tiZANidine (ZANAFLEX) 4 MG tablet Take 4 mg by mouth. 04/01/16   [provider]  triamterene-hydrochlorothiazide Joseph Pierini) 37.5-25 MG tablet  04/08/16   [provider]    Family History No family history on file.  Social History Social History  Substance Use Topics  . Smoking status: Never Smoker  . Smokeless tobacco: Never Used  . Alcohol use 0.0 oz/week     Allergies   Shellfish-derived products; Sulfa antibiotics; Sulfamethoxazole; Sumatriptan succinate; Dexamethasone sodium phosphate; and Mefenamic acid   Review of Systems Review of Systems All other  systems negative except as documented in the HPI. All pertinent positives and negatives as reviewed in the HPI. Physical Exam Updated Vital Signs BP (!) 169/96   Pulse 68   Temp 98.1 F (36.7 C) (Oral)   Resp 17   Ht 5\' 9"  (1.753 m)   Wt 81.6 kg (180 lb)   SpO2 99%   BMI 26.58 kg/m   Physical Exam  Constitutional: She is oriented to person, place, and time. She appears well-developed and well-nourished. No distress.  HENT:  Head: Normocephalic  and atraumatic.  Mouth/Throat: Oropharynx is clear and moist.  Eyes: Pupils are equal, round, and reactive to light.  Neck: Normal range of motion. Neck supple.  Cardiovascular: Normal rate, regular rhythm and normal heart sounds.  Exam reveals no gallop and no friction rub.   No murmur heard. Pulmonary/Chest: Effort normal and breath sounds normal. No respiratory distress. She has no wheezes.  Abdominal: Soft. Bowel sounds are normal. She exhibits no distension. There is no tenderness.  Neurological: She is alert and oriented to person, place, and time. No sensory deficit. She exhibits normal muscle tone. Coordination normal.  Skin: Skin is warm and dry. Capillary refill takes less than 2 seconds. No rash noted. No erythema.  Psychiatric: She has a normal mood and affect. Her behavior is normal.  Nursing note and vitals reviewed.    ED Treatments / Results  Labs (all labs ordered are listed, but only abnormal results are displayed) Labs Reviewed - No data to display  EKG  EKG Interpretation None       Radiology No results found.  Procedures Procedures (including critical care time)  Medications Ordered in ED Medications  ketorolac (TORADOL) 30 MG/ML injection 30 mg (30 mg Intravenous Given 10/31/16 1334)  sodium chloride 0.9 % bolus 500 mL (500 mLs Intravenous New Bag/Given 10/31/16 1334)  prochlorperazine (COMPAZINE) injection 10 mg (10 mg Intravenous Given 10/31/16 1409)  diphenhydrAMINE (BENADRYL) injection 25 mg (25 mg Intravenous Given 10/31/16 1405)     Initial Impression / Assessment and Plan / ED Course  I have reviewed the triage vital signs and the nursing notes.  Pertinent labs & imaging results that were available during my care of the patient were reviewed by me and considered in my medical decision making (see chart for details).    Patient will need follow-up with her pain specialist for another injection.  The patient agrees the plan and all questions were  answered   Final Clinical Impressions(s) / ED Diagnoses   Final diagnoses:  None    New Prescriptions New Prescriptions   No medications on file     Charlestine Night, PA-C 10/31/16 1506    Pricilla Loveless, MD 11/01/16 1715

## 2016-10-31 NOTE — Discharge Instructions (Signed)
Return here as needed.  Follow-up with your specialist

## 2016-10-31 NOTE — ED Triage Notes (Signed)
Pt states she had a "RFA" procedure in 4/16 for chronic headaches due to an on the job injury and they told her it would probably need this again.  Her headaches are back and to the left side since Tuesday.  No other symptoms

## 2017-06-28 ENCOUNTER — Encounter (INDEPENDENT_AMBULATORY_CARE_PROVIDER_SITE_OTHER): Payer: Self-pay | Admitting: Physician Assistant

## 2017-06-28 ENCOUNTER — Ambulatory Visit (INDEPENDENT_AMBULATORY_CARE_PROVIDER_SITE_OTHER): Admitting: Physician Assistant

## 2017-06-28 VITALS — Ht 69.0 in | Wt 180.0 lb

## 2017-06-28 DIAGNOSIS — M25522 Pain in left elbow: Secondary | ICD-10-CM

## 2017-06-28 DIAGNOSIS — M7702 Medial epicondylitis, left elbow: Secondary | ICD-10-CM

## 2017-06-28 MED ORDER — METHYLPREDNISOLONE ACETATE 40 MG/ML IJ SUSP
40.0000 mg | INTRAMUSCULAR | Status: AC | PRN
  Administered 2017-06-28: 40 mg

## 2017-06-28 MED ORDER — LIDOCAINE HCL 1 % IJ SOLN
1.0000 mL | INTRAMUSCULAR | Status: AC | PRN
  Administered 2017-06-28: 1 mL

## 2017-06-28 NOTE — Progress Notes (Signed)
   Procedure Note  Patient: Connie Zavala             Date of Birth: 1965-06-20           MRN: [id number]             Visit Date: 06/28/2017 HPI: [name] returns today complaining of left medial elbow pain.  She is requesting injection.  States her right elbow is doing well.  Is only her left elbow that is bothering her at this point in time.  She continues to do exercises as taught by physical therapy for both elbows.  Physical exam: Left elbow no rashes skin lesions ulcerations erythema.  Good range of motion.  She has tenderness over the medial epicondyle of the left elbow.  Procedures: Visit Diagnoses: Medial epicondylitis of left elbow  Hand/UE Inj: L elbow for medial epicondylitis on 06/28/2017 2:01 PM Medications: 1 mL lidocaine 1 %; 40 mg methylPREDNISolone acetate 40 MG/ML Consent was given by the patient. Patient was prepped and draped in the usual sterile fashion.    Plan: She will continue exercises as taught by therapy.  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

## 2017-09-22 ENCOUNTER — Ambulatory Visit (INDEPENDENT_AMBULATORY_CARE_PROVIDER_SITE_OTHER): Admitting: Physician Assistant

## 2017-09-22 ENCOUNTER — Encounter (INDEPENDENT_AMBULATORY_CARE_PROVIDER_SITE_OTHER): Payer: Self-pay | Admitting: Physician Assistant

## 2017-09-22 VITALS — Ht 69.0 in | Wt 190.0 lb

## 2017-09-22 DIAGNOSIS — G5602 Carpal tunnel syndrome, left upper limb: Secondary | ICD-10-CM

## 2017-09-22 MED ORDER — LIDOCAINE HCL 1 % IJ SOLN
3.0000 mL | INTRAMUSCULAR | Status: AC | PRN
  Administered 2017-09-22: 3 mL

## 2017-09-22 MED ORDER — METHYLPREDNISOLONE ACETATE 40 MG/ML IJ SUSP
40.0000 mg | INTRAMUSCULAR | Status: AC | PRN
Start: 2017-09-22 — End: 2017-09-22
  Administered 2017-09-22: 40 mg

## 2017-09-22 NOTE — Progress Notes (Signed)
Office Visit Note   Patient: Connie Zavala           Date of Birth: 1965-04-09           MRN: [id number] Visit Date: 09/22/2017              Requested by: No referring provider defined for this encounter. PCP: System, Pcp Not In   Assessment & Plan: Visit Diagnoses:  1. Carpal tunnel syndrome, left upper limb     Plan: She does have a wrist brace at home vascular to wear this at night.  Also asked her to begin taking vitamin B6 100 mg twice daily.  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  Postal paperwork was filled out today get a in regards to restrictions.  She will follow-up with us in 1 week to check her response to the injection. Note patient stated that the pain in her hand numbness and tingling began to dissipate soon after the injection today.  Also the pain up the arm began to dissipate.  Follow-Up Instructions: Return in about 1 week (around 09/29/2017).   Orders:  No orders of the defined types were placed in this encounter.  No orders of the defined types were placed in this encounter.     Procedures: Hand/UE Inj: L carpal tunnel for carpal tunnel syndrome on 09/22/2017 5:35 PM Medications: 3 mL lidocaine 1 %; 40 mg methylPREDNISolone acetate 40 MG/ML Consent was given by the patient. Immediately prior to procedure a time out was called to verify the correct patient, procedure, equipment, support staff and site/side marked as required. Patient was prepped and draped in the usual sterile fashion.       Clinical Data: No additional findings.   Subjective: Chief Complaint  Patient presents with  . Left Arm - Pain    HPI Connie Zavala is a 52 year old female who is well-known to Dr. Magnus IvanBlackman service she comes in today with a new complaint of throbbing down her entire left arm.  She states she has some numbness in her left hand at the thumb through the ring finger.  Pain does awaken her and she has to shake her hand.  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  She does have a history of some cervical neck pain and is undergone radiofrequency ablation last one being 12 of 2018 on the left at C3 and C4. At that point time she was having no radicular symptoms down the arm as her pain was in the neck and the she had associated headaches.  She denies any neck pain.  She is also been driving Voltaren gel ibuprofen for the pain with no relief.  She is also been doing stretches daily particularly of her hand. Review of Systems Please see HPI otherwise negative  Objective: Vital Signs: Ht 5\' 9"  (1.753 m)   Wt 190 lb (86.2 kg)   BMI 28.06 kg/m   Physical Exam  Constitutional: She appears well-developed and well-nourished. No distress.  Cardiovascular:  Radial pulses are 2+ and equal and symmetric.  Pulmonary/Chest: Effort normal.  Skin: She is not diaphoretic.  Psychiatric: She has a normal mood and affect.    Ortho Exam Cervical spine she has full range of motion of the cervical spine without pain.  Nontender palpation about the cervical spine.  Negative Spurling's.  Good range of motion bilateral shoulders without pain elbows wrist.  She has positive  Tinel's at the left wrist negative on the right.  Negative compression test over the median nerve at the left wrist and the right wrist.  Negative Phalen's.  She has full motor bilateral hands.  Decreased subjective sensation median nerve distribution fingers of the left hand. Specialty Comments:  No specialty comments available.  Imaging: No results found.   PMFS History: Patient Active Problem List   Diagnosis Date Noted  . Seasonal allergic rhinitis due to pollen 07/06/2016  . Mild persistent asthma without complication 07/06/2016  . History of hypertension 07/06/2016  . Wheezing 03/19/2015  . Other allergic rhinitis 03/19/2015  . Equinus deformity of foot, acquired 02/18/2015  . Metatarsalgia of both feet 02/14/2014  .  Metatarsal deformity 02/14/2014  . Acquired bilateral pes cavus 02/14/2014   Past Medical History:  Diagnosis Date  . Hypertension     No family history on file.  Past Surgical History:  Procedure Laterality Date  . RFA procedure     Social History   Occupational History  . Not on file  Tobacco Use  . Smoking status: Never Smoker  . Smokeless tobacco: Never Used  Substance and Sexual Activity  . Alcohol use: Yes    Alcohol/week: 0.0 oz  . Drug use: No  . Sexual activity: Not on file

## 2017-09-29 ENCOUNTER — Ambulatory Visit (INDEPENDENT_AMBULATORY_CARE_PROVIDER_SITE_OTHER): Admitting: Physician Assistant

## 2017-09-29 ENCOUNTER — Encounter (INDEPENDENT_AMBULATORY_CARE_PROVIDER_SITE_OTHER): Payer: Self-pay | Admitting: Physician Assistant

## 2017-09-29 DIAGNOSIS — G5602 Carpal tunnel syndrome, left upper limb: Secondary | ICD-10-CM

## 2017-09-29 NOTE — Progress Notes (Signed)
HPI: [name] returns today one week status post carpal tunnel injection on the left.  She states overall that she is feeling much better.  She is having no pain no throbbing sensation in the hand.    Physical exam: Left hand Full motor full sensation.  Negative Tinel's sign compression over the median nerve at the wrist.  Impression: Left carpal tunnel syndrome symptoms proved  Plan: Post office paperwork was reviewed reviewed and filled out for her today.  She will follow-up PRN basis.  Questions encouraged and answered.  No charge for today's office visit

## 2017-11-20 ENCOUNTER — Emergency Department (HOSPITAL_COMMUNITY)
Admission: EM | Admit: 2017-11-20 | Discharge: 2017-11-20 | Disposition: A | Discharge status: Home / Self Care | Attending: Emergency Medicine | Admitting: Emergency Medicine

## 2017-11-20 ENCOUNTER — Emergency Department (HOSPITAL_COMMUNITY)

## 2017-11-20 ENCOUNTER — Other Ambulatory Visit: Payer: Self-pay

## 2017-11-20 ENCOUNTER — Encounter (HOSPITAL_COMMUNITY): Payer: Self-pay | Admitting: Emergency Medicine

## 2017-11-20 DIAGNOSIS — I10 Essential (primary) hypertension: Secondary | ICD-10-CM | POA: Insufficient documentation

## 2017-11-20 DIAGNOSIS — X500XXA Overexertion from strenuous movement or load, initial encounter: Secondary | ICD-10-CM | POA: Insufficient documentation

## 2017-11-20 DIAGNOSIS — G8929 Other chronic pain: Secondary | ICD-10-CM | POA: Diagnosis not present

## 2017-11-20 DIAGNOSIS — M25512 Pain in left shoulder: Secondary | ICD-10-CM

## 2017-11-20 DIAGNOSIS — Z79899 Other long term (current) drug therapy: Secondary | ICD-10-CM | POA: Diagnosis not present

## 2017-11-20 MED ORDER — KETOROLAC TROMETHAMINE 60 MG/2ML IM SOLN
60.0000 mg | Freq: Once | INTRAMUSCULAR | Status: AC
  Administered 2017-11-20: 60 mg via INTRAMUSCULAR
  Filled 2017-11-20: qty 2

## 2017-11-20 NOTE — Discharge Instructions (Addendum)
Thank you for allowing me to care for you today in the Emergency Department.   Your symptoms were consistent with an acute exacerbation of chronic left shoulder pain.  Apply ice for 15 to 20 minutes up to 3-4 times a day to help with pain and swelling.  If your pain is severe, you can take 600 mg of ibuprofen with food every 6 hours for pain control, but only people like any pain control.  You can also try to perform some gentle stretches of the left shoulder if your pain allows.  Follow-up with Dr. Ophelia Charter with orthopedics or your primary care provider if your symptoms do not start to improve within the next week.  Return to the emergency department if you develop new or worsening symptoms including if your fingertips turn blue, if you have any fall or injury to the area, if you develop new numbness or weakness, fever, chills with worsening pain, or other new, concerning symptoms.

## 2017-11-20 NOTE — ED Provider Notes (Signed)
[facility] EMERGENCY DEPARTMENT Provider Note   CSN: [id number] Arrival date & time: 11/20/17  4098     History   Chief Complaint Chief Complaint  Patient presents with  . Shoulder Pain    HPI Connie Zavala is a 52 y.o. female with a history of left carpal tunnel syndrome, left medial epicondylitis, and cervical spondylosis who presents to the emergency department with a chief complaint of left shoulder pain.  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  She reports that she spends a lot of the day with a turning motion to drive the forklift.  She has had some mild discomfort in the past, but reports worsening pain that began she went home last night after a shift.  She reports that she returned to work this morning and was unable to drive the forklift due to worsening pain.  Pain is worse with range of motion of the shoulder.  She denies weakness, numbness, neck pain, left elbow or wrist pain, dyspnea, chest pain, abdominal pain, fever, or chills.  She reports that she is already taking medication at home and does not wish to take any additional pills by mouth.  She has tried lidocaine patches and diclofenac gel for other musculoskeletal injuries in the past without improvement.  The history is provided by the patient. No language interpreter was used.    Past Medical History:  Diagnosis Date  . Hypertension     Patient Active Problem List   Diagnosis Date Noted  . Seasonal allergic rhinitis due to pollen 07/06/2016  . Mild persistent asthma without complication 07/06/2016  . History of hypertension 07/06/2016  . Wheezing 03/19/2015  . Other allergic rhinitis 03/19/2015  . Equinus deformity of foot, acquired 02/18/2015  . Metatarsalgia of both feet 02/14/2014  . Metatarsal deformity 02/14/2014  . Acquired bilateral pes cavus 02/14/2014    Past Surgical  History:  Procedure Laterality Date  . RFA procedure       OB History   None      Home Medications    Prior to Admission medications   Medication Sig Start Date End Date Taking? Authorizing Provider  acetaminophen (TYLENOL) 500 MG tablet Take 500 mg by mouth. 04/01/16   [provider]  albuterol (PROVENTIL HFA) 108 (90 Base) MCG/ACT inhaler Inhale 2 puffs into the lungs every 4 (four) hours as needed for wheezing or shortness of breath. 03/19/15   Fletcher Anon, MD  albuterol (PROVENTIL HFA) 108 (90 Base) MCG/ACT inhaler Inhale 2 puffs into the lungs every 4 (four) hours as needed for wheezing or shortness of breath. 07/06/16   Fletcher Anon, MD  fluticasone Aleda Grana) 50 MCG/ACT nasal spray  02/01/14   [provider]  fluticasone (FLONASE) 50 MCG/ACT nasal spray 2 sprays per nostril once a day if needed 07/06/16   Fletcher Anon, MD  ibuprofen (ADVIL,MOTRIN) 800 MG tablet Take 1 tablet (800 mg total) by mouth every 8 (eight) hours as needed. 10/31/16   Lawyer, Cristal Deer, PA-C  montelukast (SINGULAIR) 10 MG tablet Take 1 tablet (10 mg total) by mouth at bedtime. 07/06/16   Fletcher Anon, MD  naproxen (NAPROSYN) 500 MG tablet Take 1 tablet (500 mg total) by mouth 2 (two) times daily. 04/02/16   Lavera Guise, MD  ramipril (ALTACE) 5 MG capsule Take 10 mg by mouth.  [provider]  tiZANidine (ZANAFLEX) 4 MG tablet Take 4 mg by mouth. 04/01/16   [provider]  traMADol (ULTRAM) 50 MG tablet Take 1 tablet (50 mg total) by mouth every 6 (six) hours as needed for severe pain. 10/31/16   Lawyer, Cristal Deer, PA-C  triamterene-hydrochlorothiazide [facility]) 37.5-25 MG tablet  04/08/16   [provider]    Family History No family history on file.  Social History Social History   Tobacco Use  . Smoking status: Never Smoker  . Smokeless tobacco: Never Used  Substance Use Topics  . Alcohol use: Yes    Alcohol/week: 0.0 standard  drinks  . Drug use: No     Allergies   Shellfish-derived products; Sulfa antibiotics; Sulfamethoxazole; Sumatriptan succinate; Dexamethasone sodium phosphate; and Mefenamic acid   Review of Systems Review of Systems  Constitutional: Negative for activity change, chills and fever.  Respiratory: Negative for shortness of breath.   Cardiovascular: Negative for chest pain.  Gastrointestinal: Negative for abdominal pain, diarrhea, nausea and vomiting.  Musculoskeletal: Positive for arthralgias and myalgias. Negative for back pain, gait problem, joint swelling, neck pain and neck stiffness.  Skin: Negative for rash.  Neurological: Negative for weakness and numbness.     Physical Exam Updated Vital Signs BP 131/89 (BP Location: Right Arm)   Pulse 78   Temp 98 F (36.7 C) (Oral)   Resp 18   Ht 5\' 9"  (1.753 m)   Wt 86.2 kg   SpO2 100%   BMI 28.06 kg/m   Physical Exam  Constitutional: No distress.  HENT:  Head: Normocephalic.  Eyes: Conjunctivae are normal.  Neck: Neck supple.  Cardiovascular: Normal rate and regular rhythm. Exam reveals no gallop and no friction rub.  No murmur heard. Pulmonary/Chest: Effort normal. No respiratory distress.  Abdominal: Soft. She exhibits no distension.  Musculoskeletal: She exhibits edema and tenderness. She exhibits no deformity.  Full active and passive range of motion of the left shoulder, elbow, and wrist.  No tenderness to palpation to the cervical, thoracic, or lumbar spinous processes or bilateral paraspinal muscles.  Radial pulses are 2+ and symmetric.  Good capillary refill.  Sensation is intact and equal throughout.  No tenderness to the acromion, clavicle, scapula, proximal humerus.  Tender to palpation over the musculature of the superior shoulder.  The muscle feels tight.  Negative Hawkins and Neer's test.  Negative Apley scratch test.  Negative empty can test.  Neurological: She is alert.  Skin: Skin is warm. No rash noted.    Psychiatric: Her behavior is normal.  Nursing note and vitals reviewed.    ED Treatments / Results  Labs (all labs ordered are listed, but only abnormal results are displayed) Labs Reviewed - No data to display  EKG None  Radiology Dg Shoulder Left  Result Date: 11/20/2017 CLINICAL DATA:  52 year old female with a history of shoulder injury EXAM: LEFT SHOULDER - 2+ VIEW COMPARISON:  None. FINDINGS: There is no evidence of fracture or dislocation. There is no evidence of arthropathy or other focal bone abnormality. Soft tissues are unremarkable. IMPRESSION: Negative. Electronically Signed   By: Gilmer Mor D.O.   On: 11/20/2017 10:20    Procedures Procedures (including critical care time)  Medications Ordered in ED Medications  ketorolac (TORADOL) injection 60 mg (has no administration in time range)     Initial Impression / Assessment and Plan / ED Course  I have reviewed the triage vital signs and the nursing notes.  Pertinent labs & imaging  results that were available during my care of the patient were reviewed by me and considered in my medical decision making (see chart for details).     52 year old female with a history of left carpal tunnel syndrome, left medial epicondylitis, and cervical spondylosis presenting with left shoulder pain.  She has a chronic history of left shoulder pain, that worsened after her shift last night.  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  She has frequent, repetitive motion with rotation of the left shoulder.  On exam, she is neurovascularly intact with full active and passive range of motion, but it is painful.  X-ray of the left shoulder is negative for fracture, dislocation, or joint destruction.  Pain is isolated to the supraspinatus region, but she does have a negative empty can test, so I doubt complete tear of the muscle.  She is established with Dr. Ophelia Charter  with [facility] orth[facility], and I have recommended that she follow-up with him if her symptoms do not improve with conservative management in the next week.  She declines a prescription for muscle relaxers at this time.  Doubt septic joint, gout, or cervical radiculopathy.  She is hemodynamically stable and in no acute distress.  She is safe for discharge home with outpatient follow-up at this time.  Final Clinical Impressions(s) / ED Diagnoses   Final diagnoses:  Chronic left shoulder pain  Acute pain of left shoulder    ED Discharge Orders    None       Barkley Boards, PA-C 11/20/17 1059    Tilden Fossa, MD 11/21/17 [phone]

## 2017-11-20 NOTE — ED Notes (Signed)
ED Provider at bedside. 

## 2017-11-20 NOTE — ED Triage Notes (Signed)
Pt. 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  and its getting really painful.

## 2017-11-20 NOTE — ED Notes (Addendum)
Patient transported to X-ray 

## 2017-11-20 NOTE — ED Notes (Signed)
Pt returned to room from xray.

## 2017-11-20 NOTE — ED Notes (Signed)
Patient verbalizes understanding of discharge instructions. Opportunity for questioning and answers were provided. Armband removed by staff, pt discharged from ED ambulatory.   

## 2017-11-24 ENCOUNTER — Ambulatory Visit (INDEPENDENT_AMBULATORY_CARE_PROVIDER_SITE_OTHER): Payer: Self-pay | Admitting: Physician Assistant

## 2017-12-06 ENCOUNTER — Other Ambulatory Visit: Payer: Self-pay | Admitting: Allergy

## 2017-12-08 ENCOUNTER — Encounter: Payer: Self-pay | Admitting: Allergy and Immunology

## 2017-12-08 ENCOUNTER — Ambulatory Visit (INDEPENDENT_AMBULATORY_CARE_PROVIDER_SITE_OTHER): Payer: Federal, State, Local not specified - PPO | Admitting: Allergy and Immunology

## 2017-12-08 VITALS — BP 126/84 | HR 84 | Temp 98.2°F | Resp 20 | Ht 69.0 in | Wt 198.0 lb

## 2017-12-08 DIAGNOSIS — J45901 Unspecified asthma with (acute) exacerbation: Secondary | ICD-10-CM

## 2017-12-08 DIAGNOSIS — J3089 Other allergic rhinitis: Secondary | ICD-10-CM | POA: Diagnosis not present

## 2017-12-08 MED ORDER — ALBUTEROL SULFATE HFA 108 (90 BASE) MCG/ACT IN AERS: 2.0000 | INHALATION_SPRAY | RESPIRATORY_TRACT | 1 refills | Status: AC | PRN

## 2017-12-08 MED ORDER — FLUTICASONE PROPIONATE HFA 110 MCG/ACT IN AERO: 2.0000 | INHALATION_SPRAY | Freq: Two times a day (BID) | RESPIRATORY_TRACT | 5 refills | Status: DC

## 2017-12-08 MED ORDER — FLUTICASONE PROPIONATE 50 MCG/ACT NA SUSP: NASAL | 5 refills | Status: DC

## 2017-12-08 MED ORDER — MONTELUKAST SODIUM 10 MG PO TABS: ORAL_TABLET | ORAL | 5 refills | Status: AC

## 2017-12-08 NOTE — Addendum Note (Signed)
Addended by: Candis Schatz C on: 12/08/2017 05:13 PM   Modules accepted: Orders

## 2017-12-08 NOTE — Assessment & Plan Note (Signed)
   Continue appropriate allergen avoidance measures.  Restart montelukast 10 mg daily bedtime.  Continue fluticasone nasal spray, 2 sprays per nostril daily if needed.  Nasal saline spray (i.e., Simply Saline) or nasal saline lavage (i.e., NeilMed) is recommended as needed and prior to medicated nasal sprays.

## 2017-12-08 NOTE — Assessment & Plan Note (Signed)
   Prednisone has been provided, 40 mg x3 days, 20 mg x1 day, 10 mg x1 day, then stop.  A prescription has been provided for Flovent (fluticasone) 110 g, 2 inhalations twice a day. To maximize pulmonary deposition, a spacer has been provided along with instructions for its proper administration with an HFA inhaler.  Restart/continue montelukast 10 mg daily at bedtime.  Continue albuterol HFA, 1 to 2 inhalations every 4-6 hours if needed.  The patient has been asked to contact me if her symptoms persist or progress. Otherwise, she may return for follow up in 4 months.

## 2017-12-08 NOTE — Patient Instructions (Addendum)
Asthma with acute exacerbation  Prednisone has been provided, 40 mg x3 days, 20 mg x1 day, 10 mg x1 day, then stop.  A prescription has been provided for Flovent (fluticasone) 110 g,  2 inhalations twice a day. To maximize pulmonary deposition, a spacer has been provided along with instructions for its proper administration with an HFA inhaler.  Restart/continue montelukast 10 mg daily at bedtime.  Continue albuterol HFA, 1 to 2 inhalations every 4-6 hours if needed.  The patient has been asked to contact me if her symptoms persist or progress. Otherwise, she may return for follow up in 4 months.  Other allergic rhinitis  Continue appropriate allergen avoidance measures.  Restart montelukast 10 mg daily bedtime.  Continue fluticasone nasal spray, 2 sprays per nostril daily if needed.  Nasal saline spray (i.e., Simply Saline) or nasal saline lavage (i.e., NeilMed) is recommended as needed and prior to medicated nasal sprays.   Return in about 4 months (around 04/09/2018), or if symptoms worsen or fail to improve.

## 2017-12-08 NOTE — Progress Notes (Addendum)
Follow-up Note  RE: Connie Zavala MRN: [id number] DOB: [birth year] Date of Office Visit: 12/08/2017  Primary care provider: System, Pcp Not In Referring provider: No ref. provider found  History of present illness: Connie Zavala is a 52 y.o. female with persistent asthma and allergic rhinitis presenting today for a sick visit.  She was last seen in this clinic on July 06, 2016.  She reports that she has been experiencing asthma symptoms more frequently over the past month.  She has been experiencing nocturnal awakenings due to lower respiratory symptoms 3 nights per week on average.  She had been taking montelukast 10 mg daily at bedtime, however ran out of this medication a few days ago.  Her nasal allergy symptoms are well controlled with fluticasone nasal spray as needed.  Assessment and plan: Asthma with acute exacerbation  Prednisone has been provided, 40 mg x3 days, 20 mg x1 day, 10 mg x1 day, then stop.  A prescription has been provided for Flovent (fluticasone) 110 g,  2 inhalations twice a day. To maximize pulmonary deposition, a spacer has been provided along with instructions for its proper administration with an HFA inhaler.  Restart/continue montelukast 10 mg daily at bedtime.  Continue albuterol HFA, 1 to 2 inhalations every 4-6 hours if needed.  The patient has been asked to contact me if her symptoms persist or progress. Otherwise, she may return for follow up in 4 months.  Other allergic rhinitis  Continue appropriate allergen avoidance measures.  Restart montelukast 10 mg daily bedtime.  Continue fluticasone nasal spray, 2 sprays per nostril daily if needed.  Nasal saline spray (i.e., Simply Saline) or nasal saline lavage (i.e., NeilMed) is recommended as needed and prior to medicated nasal sprays.   Meds ordered this encounter  Medications  . albuterol (PROVENTIL HFA) 108 (90 Base) MCG/ACT inhaler    Sig: Inhale 2 puffs into the lungs every 4 (four)  hours as needed for wheezing or shortness of breath.    Dispense:  1 Inhaler    Refill:  1  . fluticasone (FLONASE) 50 MCG/ACT nasal spray    Sig: 2 sprays per nostril once a day if needed for stuffy nose.    Dispense:  16 g    Refill:  5    Please keep rx on file. Pt will call when needed.  . montelukast (SINGULAIR) 10 MG tablet    Sig: Take 1 tablet once at night for coughing or wheezing.    Dispense:  34 tablet    Refill:  5  . fluticasone (FLOVENT HFA) 110 MCG/ACT inhaler    Sig: Inhale 2 puffs into the lungs 2 (two) times daily.    Dispense:  1 Inhaler    Refill:  5    Diagnostics: Spirometry reveals an FVC of 3.17 L and an FEV1 of 2.74 L without postbronchodilator improvement.  This study was performed while the patient was asymptomatic.  Please see scanned spirometry results for details.    Physical examination: Blood pressure 126/84, pulse 84, temperature 98.2 F (36.8 C), temperature source Oral, resp. rate 20, height 5\' 9"  (1.753 m), weight 197 lb 15.6 oz (89.8 kg), SpO2 97 %.  General: Alert, interactive, in no acute distress. HEENT: TMs pearly gray, turbinates mildly edematous without discharge, post-pharynx mildly erythematous. Neck: Supple without lymphadenopathy. Lungs: Clear to auscultation without wheezing, rhonchi or rales. CV: Normal S1, S2 without murmurs. Skin: Warm and dry, without lesions or rashes.  The following portions of the  patient's history were reviewed and updated as appropriate: allergies, current medications, past family history, past medical history, past social history, past surgical history and problem list.  Allergies as of 12/08/2017      Reactions   Shellfish-derived Products    Sulfa Antibiotics Hives   Sulfamethoxazole Other (See Comments)   Not noted   Sumatriptan Succinate    "Triptans"   Dexamethasone Sodium Phosphate Itching   Itching ~ 1 hr following application.  A red circle under medication pad following removal.      Mefenamic Acid Rash      Medication List        Accurate as of 12/08/17  5:13 PM. Always use your most recent med list.          acetaminophen 500 MG tablet Commonly known as:  TYLENOL Take 500 mg by mouth.   albuterol 108 (90 Base) MCG/ACT inhaler Commonly known as:  PROVENTIL HFA;VENTOLIN HFA Inhale 2 puffs into the lungs every 4 (four) hours as needed for wheezing or shortness of breath.   cyanocobalamin 1000 MCG/ML injection Commonly known as:  (VITAMIN B-12) Inject into the muscle.   estradiol 0.5 MG tablet Commonly known as:  ESTRACE   fluticasone 110 MCG/ACT inhaler Commonly known as:  FLOVENT HFA Inhale 2 puffs into the lungs 2 (two) times daily.   fluticasone 50 MCG/ACT nasal spray Commonly known as:  FLONASE 2 sprays per nostril once a day if needed for stuffy nose.   loratadine 10 MG tablet Commonly known as:  CLARITIN Take by mouth.   medroxyPROGESTERone 2.5 MG tablet Commonly known as:  PROVERA   montelukast 10 MG tablet Commonly known as:  SINGULAIR Take 1 tablet once at night for coughing or wheezing.   ramipril 10 MG capsule Commonly known as:  ALTACE   triamterene-hydrochlorothiazide 37.5-25 MG tablet Commonly known as:  MAXZIDE-25       Allergies  Allergen Reactions  . Shellfish-Derived Products   . Sulfa Antibiotics Hives  . Sulfamethoxazole Other (See Comments)    Not noted  . Sumatriptan Succinate     "Triptans"  . Dexamethasone Sodium Phosphate Itching    Itching ~ 1 hr following application.  A red circle under medication pad following removal.    . Mefenamic Acid Rash   Review of systems: Review of systems negative except as noted in HPI / PMHx or noted below: Constitutional: Negative.  HENT: Negative.   Eyes: Negative.  Respiratory: Negative.   Cardiovascular: Negative.  Gastrointestinal: Negative.  Genitourinary: Negative.  Musculoskeletal: Negative.  Neurological: Negative.  Endo/Heme/Allergies: Negative.    Cutaneous: Negative.  Past Medical History:  Diagnosis Date  . Asthma   . Hypertension     History reviewed. No pertinent family history.  Social History   Socioeconomic History  . Marital status: Single    Spouse name: Not on file  . Number of children: Not on file  . Years of education: Not on file  . Highest education level: Not on file  Occupational History  . Not on file  Social Needs  . Financial resource strain: Not on file  . Food insecurity:    Worry: Not on file    Inability: Not on file  . Transportation needs:    Medical: Not on file    Non-medical: Not on file  Tobacco Use  . Smoking status: Never Smoker  . Smokeless tobacco: Never Used  Substance and Sexual Activity  . Alcohol use: Yes    Alcohol/week: 0.0  standard drinks  . Drug use: No  . Sexual activity: Not on file  Lifestyle  . Physical activity:    Days per week: Not on file    Minutes per session: Not on file  . Stress: Not on file  Relationships  . Social connections:    Talks on phone: Not on file    Gets together: Not on file    Attends religious service: Not on file    Active member of club or organization: Not on file    Attends meetings of clubs or organizations: Not on file    Relationship status: Not on file  . Intimate partner violence:    Fear of current or ex partner: Not on file    Emotionally abused: Not on file    Physically abused: Not on file    Forced sexual activity: Not on file  Other Topics Concern  . Not on file  Social History Narrative  . Not on file    I appreciate the opportunity to take part in Jumana's care. Please do not hesitate to contact me with questions.  Sincerely,   R. Jorene Guest, MD

## 2017-12-15 ENCOUNTER — Ambulatory Visit: Payer: Self-pay | Admitting: Podiatry

## 2018-08-28 ENCOUNTER — Encounter (HOSPITAL_BASED_OUTPATIENT_CLINIC_OR_DEPARTMENT_OTHER): Payer: Self-pay | Admitting: Emergency Medicine

## 2018-08-28 ENCOUNTER — Emergency Department (HOSPITAL_BASED_OUTPATIENT_CLINIC_OR_DEPARTMENT_OTHER): Payer: Federal, State, Local not specified - PPO

## 2018-08-28 ENCOUNTER — Emergency Department (HOSPITAL_BASED_OUTPATIENT_CLINIC_OR_DEPARTMENT_OTHER)
Admission: EM | Admit: 2018-08-28 | Discharge: 2018-08-28 | Disposition: A | Discharge status: Home / Self Care | Payer: Federal, State, Local not specified - PPO | Attending: Emergency Medicine | Admitting: Emergency Medicine

## 2018-08-28 ENCOUNTER — Other Ambulatory Visit: Payer: Self-pay

## 2018-08-28 DIAGNOSIS — Y9389 Activity, other specified: Secondary | ICD-10-CM | POA: Insufficient documentation

## 2018-08-28 DIAGNOSIS — I10 Essential (primary) hypertension: Secondary | ICD-10-CM | POA: Insufficient documentation

## 2018-08-28 DIAGNOSIS — Y929 Unspecified place or not applicable: Secondary | ICD-10-CM | POA: Diagnosis not present

## 2018-08-28 DIAGNOSIS — Y998 Other external cause status: Secondary | ICD-10-CM | POA: Diagnosis not present

## 2018-08-28 DIAGNOSIS — S8992XA Unspecified injury of left lower leg, initial encounter: Secondary | ICD-10-CM | POA: Diagnosis present

## 2018-08-28 DIAGNOSIS — J45909 Unspecified asthma, uncomplicated: Secondary | ICD-10-CM | POA: Insufficient documentation

## 2018-08-28 DIAGNOSIS — W010XXA Fall on same level from slipping, tripping and stumbling without subsequent striking against object, initial encounter: Secondary | ICD-10-CM | POA: Diagnosis not present

## 2018-08-28 DIAGNOSIS — S82109A Unspecified fracture of upper end of unspecified tibia, initial encounter for closed fracture: Secondary | ICD-10-CM | POA: Diagnosis not present

## 2018-08-28 DIAGNOSIS — Z79899 Other long term (current) drug therapy: Secondary | ICD-10-CM | POA: Insufficient documentation

## 2018-08-28 DIAGNOSIS — S82142A Displaced bicondylar fracture of left tibia, initial encounter for closed fracture: Secondary | ICD-10-CM

## 2018-08-28 MED ORDER — ONDANSETRON 4 MG PO TBDP
4.0000 mg | ORAL_TABLET | Freq: Once | ORAL | Status: AC
  Administered 2018-08-28: 4 mg via ORAL
  Filled 2018-08-28: qty 1

## 2018-08-28 MED ORDER — HYDROCODONE-ACETAMINOPHEN 5-325 MG PO TABS: 1.0000 | ORAL_TABLET | Freq: Three times a day (TID) | ORAL | 0 refills | Status: DC | PRN

## 2018-08-28 MED ORDER — ONDANSETRON HCL 4 MG/2ML IJ SOLN: 4.0000 mg | Freq: Once | INTRAMUSCULAR | Status: DC

## 2018-08-28 MED ORDER — IBUPROFEN 800 MG PO TABS: 800.0000 mg | ORAL_TABLET | Freq: Once | ORAL | Status: DC

## 2018-08-28 MED ORDER — ACETAMINOPHEN 500 MG PO TABS
ORAL_TABLET | ORAL | Status: AC
  Filled 2018-08-28: qty 1

## 2018-08-28 MED ORDER — ACETAMINOPHEN 500 MG PO TABS
1000.0000 mg | ORAL_TABLET | Freq: Once | ORAL | Status: AC
  Administered 2018-08-28: 1000 mg via ORAL
  Filled 2018-08-28: qty 2

## 2018-08-28 MED ORDER — HYDROMORPHONE HCL 1 MG/ML IJ SOLN
1.0000 mg | Freq: Once | INTRAMUSCULAR | Status: AC
  Administered 2018-08-28: 1 mg via INTRAMUSCULAR
  Filled 2018-08-28: qty 1

## 2018-08-28 NOTE — ED Notes (Signed)
ED Provider at bedside. 

## 2018-08-28 NOTE — Discharge Instructions (Signed)
You can take Tylenol or Ibuprofen as directed for pain. You can alternate Tylenol and Ibuprofen every 4 hours. If you take Tylenol at 1pm, then you can take Ibuprofen at 5pm. Then you can take Tylenol again at 9pm.   Take pain medications as directed for break through pain. Do not drive or operate machinery while taking this medication.   Wear knee immobilizer for support and stabilization.  He should not bear any weight on the leg.  As we discussed, plan to follow-up with [facility] tomorrow.  Please call their office for further information.  Return the emergency department for any worsening pain, numbness/weakness of the leg or any other worsening or concerning symptoms.

## 2018-08-28 NOTE — ED Notes (Signed)
P assisted to bedside commode with this RN, EMT Belenda Cruise, RN Jorene Guest and Manuella Ghazi. Pt then placed in chair in room with knee elevated on separate chair and with 2 pillows.

## 2018-08-28 NOTE — ED Provider Notes (Signed)
[facility] EMERGENCY DEPARTMENT Provider Note   CSN: [id number] Arrival date & time: 08/28/18  1727    History   Chief Complaint Chief Complaint  Patient presents with  . Knee Pain    HPI Connie Zavala is a 53 y.o. female past with history of asthma, hypertension who presents for evaluation of left knee pain after mechanical fall.  Patient reports that she was loading up a truck and states that her foot got caught in a strap, causing her to fall and land on her left knee.  She reports the knee hit the flat bed of the truck before hitting the floor.  She states that she did not have any head injury or LOC.  She also reports that she felt like her left knee rolled over to the lateral side.  She states that since then, she has not been able to ambulate or bear weight on the knee.  She states pain is worsened with attempts to move the knee.  She denies any blood thinners.  She has not taken indications for pain.  Patient denies any numbness/weakness.     The history is provided by the patient.    Past Medical History:  Diagnosis Date  . Asthma   . Hypertension     Patient Active Problem List   Diagnosis Date Noted  . Asthma with acute exacerbation 12/08/2017  . Seasonal allergic rhinitis due to pollen 07/06/2016  . Mild persistent asthma without complication 07/06/2016  . History of hypertension 07/06/2016  . Wheezing 03/19/2015  . Other allergic rhinitis 03/19/2015  . Equinus deformity of foot, acquired 02/18/2015  . Metatarsalgia of both feet 02/14/2014  . Metatarsal deformity 02/14/2014  . Acquired bilateral pes cavus 02/14/2014    Past Surgical History:  Procedure Laterality Date  . RFA procedure       OB History   No obstetric history on file.      Home Medications    Prior to Admission medications   Medication Sig Start Date End Date Taking? Authorizing Provider  acetaminophen (TYLENOL) 500 MG tablet Take 500 mg by mouth. 04/01/16  Yes [provider]  albuterol (PROVENTIL HFA) 108 (90 Base) MCG/ACT inhaler Inhale 2 puffs into the lungs every 4 (four) hours as needed for wheezing or shortness of breath. 12/08/17  Yes Bobbitt, Heywood Ilesalph Carter, MD  cyanocobalamin (,VITAMIN B-12,) 1000 MCG/ML injection Inject into the muscle.   Yes [provider]  estradiol (ESTRACE) 0.5 MG tablet  09/28/17  Yes [provider]  fluticasone (FLONASE) 50 MCG/ACT nasal spray 2 sprays per nostril once a day if needed for stuffy nose. 12/08/17  Yes Bobbitt, Heywood Ilesalph Carter, MD  medroxyPROGESTERone (PROVERA) 2.5 MG tablet  09/28/17  Yes [provider]  montelukast (SINGULAIR) 10 MG tablet Take 1 tablet once at night for coughing or wheezing. 12/08/17  Yes Bobbitt, Heywood Ilesalph Carter, MD  ramipril (ALTACE) 10 MG capsule  10/12/17  Yes [provider]  triamterene-hydrochlorothiazide (MAXZIDE-25) 37.5-25 MG tablet  04/08/16  Yes [provider]  fluticasone (FLOVENT HFA) 110 MCG/ACT inhaler Inhale 2 puffs into the lungs 2 (two) times daily. 12/08/17   Bobbitt, Heywood Ilesalph Carter, MD  HYDROcodone-acetaminophen (NORCO/VICODIN) 5-325 MG tablet Take 1-2 tablets by mouth every 8 (eight) hours as needed. 08/28/18   Maxwell CaulLayden, Ervin Rothbauer A, PA-C  loratadine (CLARITIN) 10 MG tablet Take by mouth. 10/27/17   [provider]    Family History No family history on file.  Social History Social History  Tobacco Use  . Smoking status: Never Smoker  . Smokeless tobacco: Never Used  Substance Use Topics  . Alcohol use: Yes    Alcohol/week: 0.0 standard drinks  . Drug use: No     Allergies   Shellfish-derived products, Sulfa antibiotics, Sulfamethoxazole, Sumatriptan succinate, Dexamethasone sodium phosphate, and Mefenamic acid   Review of Systems Review of Systems  Musculoskeletal:       Knee pain  Neurological: Negative for weakness and numbness.  All other systems reviewed and are negative.    Physical Exam Updated Vital  Signs BP (!) 110/96 (BP Location: Right Arm)   Pulse 76   Temp 98.3 F (36.8 C)   Resp 16   Ht 5\' 9"  (1.753 m)   Wt 90.7 kg   SpO2 100%   BMI 29.53 kg/m   Physical Exam Vitals signs and nursing note reviewed.  Constitutional:      Appearance: She is well-developed.  HENT:     Head: Normocephalic and atraumatic.  Eyes:     General: No scleral icterus.       Right eye: No discharge.        Left eye: No discharge.     Conjunctiva/sclera: Conjunctivae normal.  Cardiovascular:     Pulses:          Dorsalis pedis pulses are 2+ on the right side and 2+ on the left side.  Pulmonary:     Effort: Pulmonary effort is normal.  Musculoskeletal:     Comments: Nurse palpation noted to anterior aspect and lateral aspect of left knee with overlying soft tissue swelling.  Extension intact but she is unable to hold extension against gravity.  Patient also unable to lift her leg up off the table.  Limited flexion secondary to pain.  Negative anterior and posterior drawer test.  She does have some mild instability noted on varus valgus stress.  No tenderness palpation noted proximal tib-fib, distal tib-fib, ankle.  No tenderness palpation noted right lower extremity.  Full range of motion of right lower extremity intact without any difficulty.  Skin:    General: Skin is warm and dry.     Comments: Good distal cap refill. LLE is not dusky in appearance or cool to touch.  Neurological:     Mental Status: She is alert.  Psychiatric:        Speech: Speech normal.        Behavior: Behavior normal.      ED Treatments / Results  Labs (all labs ordered are listed, but only abnormal results are displayed) Labs Reviewed - No data to display  EKG None  Radiology Ct Knee Left Wo Contrast  Result Date: 08/28/2018 CLINICAL DATA:  Left knee pain after injury, tibial plateau fracture on radiograph. EXAM: CT OF THE LEFT KNEE WITHOUT CONTRAST TECHNIQUE: Multidetector CT imaging of the LEFT knee was  performed according to the standard protocol. Multiplanar CT image reconstructions were also generated. COMPARISON:  Radiograph earlier this day. FINDINGS: Bones/Joint/Cartilage Comminuted displaced lateral tibial plateau fracture. There is 2 mm articular surface depression. Fracture primarily involves the posterior cortex, with the vertically oriented component posteriorly. There are small fracture fragments involving the tibial spine. No metaphyseal component. No associated fracture of the proximal fibula. No fracture of the patella or distal femur. Moderate lipohemarthrosis. Ligaments Suboptimally assessed by CT. ACL is indistinct and may be torn. PCL fibers are visualized. There is edema about the femoral insertion of the MCL. Muscles and Tendons Quadriceps and patellar tendons  are intact. Mild edema in the posterior calf musculature. Soft tissues Soft tissue edema about the knee. IMPRESSION: 1. Comminuted lateral tibial plateau fracture with 2 mm articular surface depression. Moderate lipohemarthrosis. 2. Small fracture fragments at the tibial spine and indistinct ACL, suspicious for ACL tear and avulsion fractures. Electronically Signed   By: Narda RutherfordMelanie  Sanford M.D.   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 FINDINGS: A vertical fracture through the lateral tibial plateau. Minimal depression of the tibial plateau. Fracture of the lateral and medial tibial spines Lipohemarthrosis noted in the suprapatellar joint. IMPRESSION: Lateral tibial plateau fracture. Electronically Signed   By: Genevive BiStewart  Edmunds M.D.   On: 08/28/2018 18:57    Procedures Procedures (including critical care time)  Medications Ordered in ED Medications  acetaminophen  (TYLENOL) 500 MG tablet (  Not Given 08/28/18 1909)  acetaminophen (TYLENOL) tablet 1,000 mg (1,000 mg Oral Given 08/28/18 1836)  HYDROmorphone (DILAUDID) injection 1 mg (1 mg Intramuscular Given 08/28/18 2109)  ondansetron (ZOFRAN-ODT) disintegrating tablet 4 mg (4 mg Oral Given 08/28/18 2109)     Initial Impression / Assessment and Plan / ED Course  I have reviewed the triage vital signs and the nursing notes.  Pertinent labs & imaging results that were available during my care of the patient were reviewed by me and considered in my medical decision making (see chart for details).        53 year old female who presents for evaluation of left knee pain after an injury that occurred just prior to ED arrival.  Is not been able to ambulate or bear weight since then.  No numbness/weakness. Patient is afebrile, non-toxic appearing, sitting comfortably on examination table. Vital signs reviewed and stable. Patient is neurovascularly intact.  Concern for tendon disruption, particular given inability to lift it off the table, fracture versus dislocation.  X-rays ordered at triage.  X-rays reviewed.  There appears to be lateral tibial plateau fracture.  Discussed patient with Dr. August [facility]rean [facility]  He recommends obtaining a CT of the knee.  Patient then can be discharged in knee immobilizer with nonweightbearing with plans to follow-up in the office tomorrow.  I discussed with patient.  She is agreeable to plan.  Instructed patient regarding at home supportive care measures.  Instructed patient to follow-up with referred outpatient orthopedic doctor as directed. At this time, patient exhibits no emergent life-threatening condition that require further evaluation in ED. plan for discharge home with short course of pain medication given acute fracture.  Patient had ample opportunity for questions and discussion. All patient's questions were answered with full understanding. Strict return precautions  discussed. Patient expresses understanding and agreement to plan.   Portions of this note were generated with Scientist, clinical (histocompatibility and immunogenetics)Dragon dictation software. Dictation errors may occur despite best attempts at proofreading.  Final Clinical Impressions(s) / ED Diagnoses   Final diagnoses:  Closed fracture of left tibial plateau, initial encounter    ED Discharge Orders         Ordered    HYDROcodone-acetaminophen (NORCO/VICODIN) 5-325 MG tablet  Every 8 hours PRN     08/28/18 2058           Maxwell CaulLayden, Giovanie Lefebre A, PA-C 08/28/18 2232    Pricilla LovelessGoldston, Scott,  MD 08/28/18 2257

## 2018-08-28 NOTE — ED Triage Notes (Signed)
Reports stepping on a strap getting into the work truck and her left knee rolled.  Unable to bear weight afterwards.

## 2018-08-28 NOTE — ED Notes (Signed)
PMS intact before and after. Pt tolerated well. All questions answered. 

## 2018-08-29 ENCOUNTER — Telehealth: Payer: Self-pay | Admitting: Orthopedic Surgery

## 2018-08-29 NOTE — Telephone Encounter (Signed)
pls advise. Thanks.  

## 2018-08-29 NOTE — Telephone Encounter (Signed)
Ok for that

## 2018-08-29 NOTE — Telephone Encounter (Signed)
Patient called asked if she can get a Rx for a porta potty. Patient said she is 5'9. The number to contact patient is [phone]

## 2018-08-30 ENCOUNTER — Telehealth: Payer: Self-pay | Admitting: Orthopedic Surgery

## 2018-08-30 NOTE — Telephone Encounter (Signed)
I s/w patient and she will pick up at her OV tomorrow

## 2018-08-30 NOTE — Telephone Encounter (Signed)
Patient called asked if the Rx for a porta potty can be faxed over to [facility]. The fax# is [phone]   The number to contact patient is [phone]

## 2018-08-30 NOTE — Telephone Encounter (Signed)
Faxed will give patient copy as well tomorrow

## 2018-08-31 ENCOUNTER — Other Ambulatory Visit: Payer: Self-pay

## 2018-08-31 ENCOUNTER — Encounter: Payer: Self-pay | Admitting: Orthopedic Surgery

## 2018-08-31 ENCOUNTER — Ambulatory Visit (INDEPENDENT_AMBULATORY_CARE_PROVIDER_SITE_OTHER): Admitting: Orthopedic Surgery

## 2018-08-31 DIAGNOSIS — S82142A Displaced bicondylar fracture of left tibia, initial encounter for closed fracture: Secondary | ICD-10-CM | POA: Diagnosis not present

## 2018-09-01 ENCOUNTER — Encounter: Payer: Self-pay | Admitting: Orthopedic Surgery

## 2018-09-01 DIAGNOSIS — S82142A Displaced bicondylar fracture of left tibia, initial encounter for closed fracture: Secondary | ICD-10-CM | POA: Diagnosis not present

## 2018-09-01 MED ORDER — BUPIVACAINE HCL 0.25 % IJ SOLN
4.0000 mL | INTRAMUSCULAR | Status: AC | PRN
  Administered 2018-09-01: 09:00:00 4 mL via INTRA_ARTICULAR

## 2018-09-01 MED ORDER — LIDOCAINE HCL 1 % IJ SOLN
5.0000 mL | INTRAMUSCULAR | Status: AC | PRN
Start: 2018-09-01 — End: 2018-09-01
  Administered 2018-09-01: 5 mL

## 2018-09-01 NOTE — Progress Notes (Signed)
Office Visit Note   Patient: Connie Zavala           Date of Birth: Jul 24, 1965           MRN: [id number] Visit Date: 08/31/2018 Requested by: No referring provider defined for this encounter. PCP: System, Pcp Not In  Subjective: Chief Complaint  Patient presents with  . Left Knee - Injury    HPI: Connie Zavala is a 53 year old patient who injured her left knee 08/28/2018.  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  She felt a pop and had immediate onset of pain and swelling.  She works at the [employer].  No personal or family history of DVT.  She has been ambulating nonweightbearing in a knee immobilizer.              ROS: All systems reviewed are negative as they relate to the chief complaint within the history of present illness.  Patient denies  fevers or chills.   Assessment & Plan: Visit Diagnoses:  1. Closed fracture of left tibial plateau, initial encounter     Plan: Impression is left posterior lateral tibial plateau fracture which is depressed only about 2 mm.  She has significant effusion.  CT scan demonstrates the fracture as well as some incontinuity potentially of the ACL.  Aspiration of the knee today yielded 70 cc of bloody fluid.  I think she does have some laxity.  She needs an MRI scan of that left knee to evaluate ACL tear with significant pivot shift injury which is giving her a posterior lateral tibial plateau fracture.  I do want her to remain nonweightbearing but work on knee range of motion exercises.  Follow-up with me after that MRI scan.  She will need to be out of work at least 8 to 12 weeks to allow for recovery and she may need surgical intervention also.  Follow-Up Instructions: Return for after MRI.   Orders:  Orders Placed This Encounter  Procedures  . MR Knee Left w/o contrast   No orders of the defined types were placed in this encounter.     Procedures: Large Joint Inj: L knee on 09/01/2018 8:38 AM Indications: diagnostic  evaluation, joint swelling and pain Details: 18 G 1.5 in needle, superolateral approach  Arthrogram: No  Medications: 5 mL lidocaine 1 %; 4 mL bupivacaine 0.25 % Aspirate: 70 mL bloody Outcome: tolerated well, no immediate complications Procedure, treatment alternatives, risks and benefits explained, specific risks discussed. Consent was given by the patient. Immediately prior to procedure a time out was called to verify the correct patient, procedure, equipment, support staff and site/Zavala marked as required. Patient was prepped and draped in the usual sterile fashion.       Clinical Data: No additional findings.  Objective: Vital Signs: There were no vitals taken for this visit.  Physical Exam:   Constitutional: Patient appears well-developed HEENT:  Head: Normocephalic Eyes:EOM are normal Neck: Normal range of motion Cardiovascular: Normal rate Pulmonary/chest: Effort normal Neurologic: Patient is alert Skin: Skin is warm Psychiatric: Patient has normal mood and affect    Ortho Exam: Ortho exam demonstrates effusion in that left knee.  Pedal pulses palpable.  Compartments are soft.  Extensor mechanism is intact.  Negative patellar apprehension.  Patient has lateral joint line tenderness.  After aspiration of the knee joint I did inject some Marcaine and although the exam was still guarded I think she does have some laxity of the central  knee ligaments.  Specialty Comments:  No specialty comments available.  Imaging: No results found.   PMFS History: Patient Active Problem List   Diagnosis Date Noted  . Asthma with acute exacerbation 12/08/2017  . Seasonal allergic rhinitis due to pollen 07/06/2016  . Mild persistent asthma without complication 07/06/2016  . History of hypertension 07/06/2016  . Wheezing 03/19/2015  . Other allergic rhinitis 03/19/2015  . Equinus deformity of foot, acquired 02/18/2015  . Metatarsalgia of both feet 02/14/2014  . Metatarsal  deformity 02/14/2014  . Acquired bilateral pes cavus 02/14/2014   Past Medical History:  Diagnosis Date  . Asthma   . Hypertension     History reviewed. No pertinent family history.  Past Surgical History:  Procedure Laterality Date  . RFA procedure     Social History   Occupational History  . Not on file  Tobacco Use  . Smoking status: Never Smoker  . Smokeless tobacco: Never Used  Substance and Sexual Activity  . Alcohol use: Yes    Alcohol/week: 0.0 standard drinks  . Drug use: No  . Sexual activity: Not on file

## 2018-09-02 ENCOUNTER — Other Ambulatory Visit: Payer: Self-pay | Admitting: Orthopedic Surgery

## 2018-09-02 ENCOUNTER — Telehealth: Payer: Self-pay | Admitting: Orthopedic Surgery

## 2018-09-02 MED ORDER — HYDROCODONE-ACETAMINOPHEN 5-325 MG PO TABS: ORAL_TABLET | ORAL | 0 refills | Status: DC

## 2018-09-02 NOTE — Telephone Encounter (Signed)
Patient called asked if the Rx for Hydrocodone been sent to her pharmacy yet? Patient also said the Rx for the [facility] was not received and she would like a copy of the Rx. The number to contact patient is [phone]

## 2018-09-02 NOTE — Telephone Encounter (Signed)
IC s/w patient advised that Dr Marlou Sa submitted rx. Also advised could refax rx for bedside commode

## 2018-09-06 ENCOUNTER — Telehealth: Payer: Self-pay | Admitting: Radiology

## 2018-09-06 NOTE — Telephone Encounter (Signed)
[facility] Fax: [phone]  Faxed MRI order ATTN: Larene Beach or Danton Clap

## 2018-09-09 ENCOUNTER — Encounter: Payer: Self-pay | Admitting: Orthopedic Surgery

## 2018-09-09 ENCOUNTER — Ambulatory Visit (INDEPENDENT_AMBULATORY_CARE_PROVIDER_SITE_OTHER): Payer: Federal, State, Local not specified - PPO | Admitting: Orthopedic Surgery

## 2018-09-09 ENCOUNTER — Other Ambulatory Visit: Payer: Self-pay

## 2018-09-09 DIAGNOSIS — S82142A Displaced bicondylar fracture of left tibia, initial encounter for closed fracture: Secondary | ICD-10-CM

## 2018-09-09 DIAGNOSIS — S83512D Sprain of anterior cruciate ligament of left knee, subsequent encounter: Secondary | ICD-10-CM

## 2018-09-09 NOTE — Progress Notes (Signed)
   Post-Op Visit Note   Patient: Connie Zavala           Date of Birth: [birth year]           MRN: [id number] Visit Date: 09/09/2018 PCP: System, Pcp Not In   Assessment & Plan:  Chief Complaint:  Chief Complaint  Patient presents with  . Follow-up   Visit Diagnoses:  1. Closed fracture of left tibial plateau, initial encounter     Plan: Connie Zavala is a patient with left knee pain.  She has tibial plateau fracture from an injury.  Since have seen her she is had an MRI scan.  That scan is reviewed and it does show ACL tear along with fairly minimally displaced lateral tibial plateau fracture.  Collaterals are intact.  Menisci intact.  On exam she has recurrent mild effusion.  Aspiration was performed last clinic visit.  She can flex it to about 80 degrees.  Extension lacks only about 5 degrees.  I want her to start working on physical therapy for pre-rehabilitation exercises to regain flexion past 90 and full extension.  I will see her back in about 10 days for clinical recheck and I think by then she should be ready to get this fixed.  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  No calf tenderness today with negative Homans sign.  Follow-Up Instructions: No follow-ups on file.   Orders:  Orders Placed This Encounter  Procedures  . Ambulatory referral to Physical Therapy   No orders of the defined types were placed in this encounter.   Imaging: No results found.  PMFS History: Patient Active Problem List   Diagnosis Date Noted  . Asthma with acute exacerbation 12/08/2017  . Seasonal allergic rhinitis due to pollen 07/06/2016  . Mild persistent asthma without complication 92/42/6834  . History of hypertension 07/06/2016  . Wheezing 03/19/2015  . Other allergic rhinitis 03/19/2015  . Equinus deformity of foot, acquired 02/18/2015  . Metatarsalgia of both feet 02/14/2014  . Metatarsal  deformity 02/14/2014  . Acquired bilateral pes cavus 02/14/2014   Past Medical History:  Diagnosis Date  . Asthma   . Hypertension     History reviewed. No pertinent family history.  Past Surgical History:  Procedure Laterality Date  . RFA procedure     Social History   Occupational History  . Not on file  Tobacco Use  . Smoking status: Never Smoker  . Smokeless tobacco: Never Used  Substance and Sexual Activity  . Alcohol use: Yes    Alcohol/week: 0.0 standard drinks  . Drug use: No  . Sexual activity: Not on file

## 2018-09-19 ENCOUNTER — Other Ambulatory Visit: Payer: Self-pay

## 2018-09-19 ENCOUNTER — Ambulatory Visit (INDEPENDENT_AMBULATORY_CARE_PROVIDER_SITE_OTHER): Payer: Federal, State, Local not specified - PPO | Admitting: Orthopedic Surgery

## 2018-09-19 ENCOUNTER — Encounter: Payer: Self-pay | Admitting: Orthopedic Surgery

## 2018-09-19 DIAGNOSIS — S83512D Sprain of anterior cruciate ligament of left knee, subsequent encounter: Secondary | ICD-10-CM

## 2018-09-20 ENCOUNTER — Encounter: Payer: Self-pay | Admitting: Orthopedic Surgery

## 2018-09-20 NOTE — Progress Notes (Signed)
   Post-Op Visit Note   Patient: Connie Zavala           Date of Birth: [birth year]           MRN: [id number] Visit Date: 09/19/2018 PCP: System, Pcp Not In   Assessment & Plan:  Chief Complaint:  Chief Complaint  Patient presents with  . Left Knee - Follow-up   Visit Diagnoses:  1. Rupture of anterior cruciate ligament of left knee, subsequent encounter     Plan: Connie Zavala is a 53 year old patient with left tibial plateau fracture and ACL tear.  She is here for 10-day clinical recheck.  On exam she does have extension lacking only about about 5 degrees.  Is significantly better than last clinic visit she is able to flex past 90.  Mild effusion is present.  ACL laxity is also present.  She has been doing little touchdown weightbearing.  She is now about 4 weeks out from injury.  I like to get her some pre-rehabilitation just to get that last little bit of full extension back and full flexion.  We will go ahead and get her set up for ACL reconstruction.  She is an active person.  Discussed the risk and benefits of ACL reconstruction including but not limited to infection nerve vessel damage incomplete restoration of function as well as the extensive rehabilitative process required.  Patient understands the risk and benefits and wishes to proceed.  All questions answered.  Follow-Up Instructions: No follow-ups on file.   Orders:  No orders of the defined types were placed in this encounter.  No orders of the defined types were placed in this encounter.   Imaging: No results found.  PMFS History: Patient Active Problem List   Diagnosis Date Noted  . Asthma with acute exacerbation 12/08/2017  . Seasonal allergic rhinitis due to pollen 07/06/2016  . Mild persistent asthma without complication 64/40/3474  . History of hypertension 07/06/2016  . Wheezing 03/19/2015  . Other allergic rhinitis 03/19/2015  . Equinus deformity of foot, acquired 02/18/2015  . Metatarsalgia of both feet  02/14/2014  . Metatarsal deformity 02/14/2014  . Acquired bilateral pes cavus 02/14/2014   Past Medical History:  Diagnosis Date  . Asthma   . Hypertension     History reviewed. No pertinent family history.  Past Surgical History:  Procedure Laterality Date  . RFA procedure     Social History   Occupational History  . Not on file  Tobacco Use  . Smoking status: Never Smoker  . Smokeless tobacco: Never Used  Substance and Sexual Activity  . Alcohol use: Yes    Alcohol/week: 0.0 standard drinks  . Drug use: No  . Sexual activity: Not on file

## 2018-10-20 ENCOUNTER — Telehealth: Payer: Self-pay

## 2018-10-20 NOTE — Telephone Encounter (Signed)
Patient states she received a letter from dept of labor stating her work comp claim is denied. She would like to go ahead and set up her surgery under her bcbs. I advised her to bring the denial letter to the office the next time she comes in so we can have a copy of it for our records.

## 2018-10-27 ENCOUNTER — Telehealth: Payer: Self-pay | Admitting: Orthopedic Surgery

## 2018-10-27 NOTE — Telephone Encounter (Signed)
Patient is scheduled for left knee ACL reconstruction at [facility] on 11-07-18 at 10:00. CPM is ordered and [employer] will contact patient. Patient states she is very tall (Ht 5'9  Wt 200 lbs).  She is asking how she would go about getting a raised toilet seat or toilet chair. I told patient that I'd call her back. Ruby Cola with [employer] states they are normally covered with totals, but not sure with ACL.  Reconstruction, and it all depends on patient's insurance.  If insurance doesn't pay, he is still able to provide the standard seat for around $60.  Please advise.

## 2018-10-27 NOTE — Telephone Encounter (Signed)
Ok to order this through [facility]

## 2018-11-02 ENCOUNTER — Telehealth: Payer: Self-pay | Admitting: *Deleted

## 2018-11-02 NOTE — Telephone Encounter (Signed)
Cathy from [facility] requesting notes for pts upcoming procedure. Faxed to [phone]. Called pt and she gave me a verbal consent to do so.

## 2018-11-07 ENCOUNTER — Other Ambulatory Visit: Payer: Self-pay | Admitting: Surgical

## 2018-11-07 ENCOUNTER — Encounter: Payer: Self-pay | Admitting: Orthopedic Surgery

## 2018-11-07 DIAGNOSIS — S83512A Sprain of anterior cruciate ligament of left knee, initial encounter: Secondary | ICD-10-CM

## 2018-11-07 MED ORDER — ASPIRIN EC 81 MG PO TBEC: 81.0000 mg | DELAYED_RELEASE_TABLET | Freq: Every day | ORAL | 0 refills | Status: AC

## 2018-11-07 MED ORDER — METHOCARBAMOL 500 MG PO TABS: 500.0000 mg | ORAL_TABLET | Freq: Three times a day (TID) | ORAL | 0 refills | Status: DC | PRN

## 2018-11-07 MED ORDER — OXYCODONE HCL 5 MG PO TABS: 5.0000 mg | ORAL_TABLET | Freq: Four times a day (QID) | ORAL | 0 refills | Status: AC | PRN

## 2018-11-16 ENCOUNTER — Ambulatory Visit (INDEPENDENT_AMBULATORY_CARE_PROVIDER_SITE_OTHER): Payer: Federal, State, Local not specified - PPO | Admitting: Orthopedic Surgery

## 2018-11-16 ENCOUNTER — Encounter: Payer: Self-pay | Admitting: Orthopedic Surgery

## 2018-11-16 DIAGNOSIS — S83512D Sprain of anterior cruciate ligament of left knee, subsequent encounter: Secondary | ICD-10-CM

## 2018-11-16 NOTE — Progress Notes (Signed)
   Post-Op Visit Note   Patient: Connie Zavala           Date of Birth: 1965-08-29           MRN: [id number] Visit Date: 11/16/2018 PCP: System, Pcp Not In   Assessment & Plan:  Chief Complaint:  Chief Complaint  Patient presents with  . Left Knee - Routine Post Op   Visit Diagnoses: No diagnosis found.  Plan: Pt is a 53 y.o. Female s/p L knee ACL reconstruction with HS autograft on 11/07/2018.  Pt states she is doing well.  She is taking her pain medication 1-2 times per day still, along with some Aleve.  The graft is stable on exam and her incisions are healing well.  She is walking with one crutch.  She needs to continue to work on her ROM, but she is close to full extension and at about 50-60 degrees of flexion; she is at 75 degrees on her CPM machine.  She has not been able to do any straight leg raises yet.  She is still taking aspirin and has no calf tenderness.  She will begin outpatient therapy and followup in 2 weeks.  Follow-Up Instructions: No follow-ups on file.   Orders:  No orders of the defined types were placed in this encounter.  No orders of the defined types were placed in this encounter.   Imaging: No results found.  PMFS History: Patient Active Problem List   Diagnosis Date Noted  . Asthma with acute exacerbation 12/08/2017  . Seasonal allergic rhinitis due to pollen 07/06/2016  . Mild persistent asthma without complication 48/18/5631  . History of hypertension 07/06/2016  . Wheezing 03/19/2015  . Other allergic rhinitis 03/19/2015  . Equinus deformity of foot, acquired 02/18/2015  . Metatarsalgia of both feet 02/14/2014  . Metatarsal deformity 02/14/2014  . Acquired bilateral pes cavus 02/14/2014   Past Medical History:  Diagnosis Date  . Asthma   . Hypertension     History reviewed. No pertinent family history.  Past Surgical History:  Procedure Laterality Date  . RFA procedure     Social History   Occupational History  . Not on file   Tobacco Use  . Smoking status: Never Smoker  . Smokeless tobacco: Never Used  Substance and Sexual Activity  . Alcohol use: Yes    Alcohol/week: 0.0 standard drinks  . Drug use: No  . Sexual activity: Not on file

## 2018-11-30 ENCOUNTER — Encounter: Payer: Self-pay | Admitting: Orthopedic Surgery

## 2018-11-30 ENCOUNTER — Ambulatory Visit (INDEPENDENT_AMBULATORY_CARE_PROVIDER_SITE_OTHER): Payer: Federal, State, Local not specified - PPO | Admitting: Orthopedic Surgery

## 2018-11-30 ENCOUNTER — Ambulatory Visit: Payer: Federal, State, Local not specified - PPO | Admitting: Orthopedic Surgery

## 2018-11-30 DIAGNOSIS — S83512D Sprain of anterior cruciate ligament of left knee, subsequent encounter: Secondary | ICD-10-CM

## 2018-11-30 NOTE — Progress Notes (Signed)
   Post-Op Visit Note   Patient: Connie Zavala           Date of Birth: 1965-09-12           MRN: [id number] Visit Date: 11/30/2018 PCP: System, Pcp Not In   Assessment & Plan:  Chief Complaint: No chief complaint on file.  Visit Diagnoses: No diagnosis found.  Plan: Coralyn Mark is now about 3 weeks out left knee ACL reconstruction on exam she is lacking about 7 degrees of full extension and has flexion to 90 graft is stable no calf tenderness walking is slightly antalgic getting C-spine ablation in 2 weeks on her to continue to work on range of motion exercises particularly full extension and getting past 90 degrees of flexion come back in 3 weeks for clinical recheck.  She is at 80 on her CPM machine will hold off on that.  Follow-Up Instructions: Return in about 3 weeks (around 12/21/2018).   Orders:  No orders of the defined types were placed in this encounter.  No orders of the defined types were placed in this encounter.   Imaging: No results found.  PMFS History: Patient Active Problem List   Diagnosis Date Noted  . Asthma with acute exacerbation 12/08/2017  . Seasonal allergic rhinitis due to pollen 07/06/2016  . Mild persistent asthma without complication 38/75/6433  . History of hypertension 07/06/2016  . Wheezing 03/19/2015  . Other allergic rhinitis 03/19/2015  . Equinus deformity of foot, acquired 02/18/2015  . Metatarsalgia of both feet 02/14/2014  . Metatarsal deformity 02/14/2014  . Acquired bilateral pes cavus 02/14/2014   Past Medical History:  Diagnosis Date  . Asthma   . Hypertension     History reviewed. No pertinent family history.  Past Surgical History:  Procedure Laterality Date  . RFA procedure     Social History   Occupational History  . Not on file  Tobacco Use  . Smoking status: Never Smoker  . Smokeless tobacco: Never Used  Substance and Sexual Activity  . Alcohol use: Yes    Alcohol/week: 0.0 standard drinks  . Drug use: No  .  Sexual activity: Not on file

## 2018-12-21 ENCOUNTER — Ambulatory Visit (INDEPENDENT_AMBULATORY_CARE_PROVIDER_SITE_OTHER): Payer: Federal, State, Local not specified - PPO | Admitting: Orthopedic Surgery

## 2018-12-21 ENCOUNTER — Encounter: Payer: Self-pay | Admitting: Orthopedic Surgery

## 2018-12-21 DIAGNOSIS — S83512D Sprain of anterior cruciate ligament of left knee, subsequent encounter: Secondary | ICD-10-CM

## 2018-12-21 NOTE — Progress Notes (Signed)
   Post-Op Visit Note   Patient: Connie Zavala           Date of Birth: [birth year]           MRN: [id number] Visit Date: 12/21/2018 PCP: System, Pcp Not In   Assessment & Plan:  Chief Complaint:  Chief Complaint  Patient presents with  . Left Knee - Follow-up   Visit Diagnoses:  1. Rupture of anterior cruciate ligament of left knee, subsequent encounter     Plan: Patient is a 52 year old female who presents 6 weeks status post left knee ACL reconstruction.  Patient states that she is doing well and has been Jordan her knee at home on her own.  Is not going to physical therapy.  Patient has great motion coming to 0 degrees and past 90 degrees of flexion.  She has no calf tenderness on exam and a negative Homans sign.  The graft is stable on exam.  She has a small effusion.  Denies any fevers, chills, night sweats.  Her workers comp has been denied and so she must return back to work.  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  This does not involve a lot of lifting but a lot of up, down motion.  Patient may return to work as soon as possible.  Patient given a 20 pound lifting restriction.  Patient will follow-up with the office in 6 weeks.  Follow-Up Instructions: No follow-ups on file.   Orders:  No orders of the defined types were placed in this encounter.  No orders of the defined types were placed in this encounter.   Imaging: No results found.  PMFS History: Patient Active Problem List   Diagnosis Date Noted  . Asthma with acute exacerbation 12/08/2017  . Seasonal allergic rhinitis due to pollen 07/06/2016  . Mild persistent asthma without complication 85/88/5027  . History of hypertension 07/06/2016  . Wheezing 03/19/2015  . Other allergic rhinitis 03/19/2015  . Equinus deformity of foot, acquired 02/18/2015  . Metatarsalgia of both feet 02/14/2014  . Metatarsal deformity 02/14/2014  . Acquired bilateral pes cavus 02/14/2014   Past Medical  History:  Diagnosis Date  . Asthma   . Hypertension     No family history on file.  Past Surgical History:  Procedure Laterality Date  . RFA procedure     Social History   Occupational History  . Not on file  Tobacco Use  . Smoking status: Never Smoker  . Smokeless tobacco: Never Used  Substance and Sexual Activity  . Alcohol use: Yes    Alcohol/week: 0.0 standard drinks  . Drug use: No  . Sexual activity: Not on file

## 2018-12-26 ENCOUNTER — Other Ambulatory Visit: Payer: Self-pay

## 2018-12-26 ENCOUNTER — Encounter: Payer: Self-pay | Admitting: Orthopedic Surgery

## 2018-12-26 ENCOUNTER — Ambulatory Visit (INDEPENDENT_AMBULATORY_CARE_PROVIDER_SITE_OTHER): Payer: Federal, State, Local not specified - PPO | Admitting: Orthopedic Surgery

## 2018-12-26 VITALS — Ht 69.0 in | Wt 200.0 lb

## 2018-12-26 DIAGNOSIS — S83512D Sprain of anterior cruciate ligament of left knee, subsequent encounter: Secondary | ICD-10-CM

## 2018-12-27 ENCOUNTER — Telehealth: Payer: Self-pay

## 2018-12-27 NOTE — Telephone Encounter (Signed)
Pt called regarding her [employer]pt of [employer] work comp claim. She states claim has still not been accepted from them and is currently "under development" but they will need to still be receiving all records in order to make a decision. Asked that all records from June to present be mailed to them.   Printed records because claim # has to be wrote on every single page and Will mail all records from 08/31/18 to present to dept of labor Attn:claim#[id number].

## 2018-12-30 ENCOUNTER — Ambulatory Visit: Payer: Federal, State, Local not specified - PPO | Admitting: Orthopedic Surgery

## 2018-12-31 ENCOUNTER — Encounter: Payer: Self-pay | Admitting: Orthopedic Surgery

## 2018-12-31 NOTE — Progress Notes (Signed)
   Post-Op Visit Note   Patient: Connie Zavala           Date of Birth: [birth year]           MRN: [id number] Visit Date: 12/26/2018 PCP: System, Pcp Not In   Assessment & Plan:  Chief Complaint:  Chief Complaint  Patient presents with  . bilateral lower extremity    swelling   Visit Diagnoses:  1. Rupture of anterior cruciate ligament of left knee, subsequent encounter     Plan: Connie Zavala presents with bilateral leg swelling.  She went back to work on Thursday, 12/22/2018.  She reports some swelling in both legs involving the ankles.  Denies much in the way of pain.  She is doing well following ACL reconstruction about 6 weeks ago.  She is taking triamterene hydrochlorothiazide for blood pressure.  On exam she has excellent range of motion of that left knee with trace effusion and good stable graft.  A little bit of weakness which is to be expected.  Does have mild pitting edema 1+ bilateral lower extremities with no calf tenderness and negative Homans bilaterally.  Impression is mildly increased volume in the legs.  I think this is more of a medical problem no evidence of DVT.  Left knee is doing well.  Recommended follow-up with primary care provider for management of this problem.  Compression hose could be considered but not sure if she wants to do that.  Follow-Up Instructions: Return if symptoms worsen or fail to improve.   Orders:  No orders of the defined types were placed in this encounter.  No orders of the defined types were placed in this encounter.   Imaging: No results found.  PMFS History: Patient Active Problem List   Diagnosis Date Noted  . Asthma with acute exacerbation 12/08/2017  . Seasonal allergic rhinitis due to pollen 07/06/2016  . Mild persistent asthma without complication 23/53/6144  . History of hypertension 07/06/2016  . Wheezing 03/19/2015  . Other allergic rhinitis 03/19/2015  . Equinus deformity of foot, acquired 02/18/2015  . Metatarsalgia  of both feet 02/14/2014  . Metatarsal deformity 02/14/2014  . Acquired bilateral pes cavus 02/14/2014   Past Medical History:  Diagnosis Date  . Asthma   . Hypertension     History reviewed. No pertinent family history.  Past Surgical History:  Procedure Laterality Date  . RFA procedure     Social History   Occupational History  . Not on file  Tobacco Use  . Smoking status: Never Smoker  . Smokeless tobacco: Never Used  Substance and Sexual Activity  . Alcohol use: Yes    Alcohol/week: 0.0 standard drinks  . Drug use: No  . Sexual activity: Not on file

## 2019-02-01 ENCOUNTER — Ambulatory Visit: Payer: Federal, State, Local not specified - PPO | Admitting: Orthopedic Surgery

## 2019-02-06 ENCOUNTER — Other Ambulatory Visit: Payer: Self-pay

## 2019-02-06 ENCOUNTER — Encounter: Payer: Self-pay | Admitting: Orthopedic Surgery

## 2019-02-06 ENCOUNTER — Ambulatory Visit (INDEPENDENT_AMBULATORY_CARE_PROVIDER_SITE_OTHER): Admitting: Orthopedic Surgery

## 2019-02-06 DIAGNOSIS — S83512D Sprain of anterior cruciate ligament of left knee, subsequent encounter: Secondary | ICD-10-CM

## 2019-02-06 NOTE — Progress Notes (Signed)
   Post-Op Visit Note   Patient: Connie Zavala           Date of Birth: 08/08/1965           MRN: [id number] Visit Date: 02/06/2019 PCP: System, Pcp Not In   Assessment & Plan:  Chief Complaint:  Chief Complaint  Patient presents with  . Left Knee - Follow-up   Visit Diagnoses:  1. Rupture of anterior cruciate ligament of left knee, subsequent encounter     Plan: Patient is a 53 year old female who presents s/p left knee ACL reconstruction with hamstring autograft on 11/07/2018.  She was last seen a few days after returning to work, on 12/26/2018.  She returns complaining of sharp pain with increased swelling of the left knee that causes her significant distress after her work days.  She is working 7 days a week for 12 hours each day.  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  She is using a knee sleeve, gabapentin, ibuprofen, Robaxin with some relief.  She requests medicine refills.  On exam she has 0 degrees of extension.  She has painful flexion with guarding past 45 degrees of flexion.  She has positive effusion.  She has not done any sort of physical therapy or any home exercise programs.  Patient notes that she is too exhausted at the end of her workdays to do any home exercises.  In the office today, the left knee was aspirated of joint effusion and injected with Toradol.  Provided work note to patient limiting her to 8 hours of work per day.  Patient will begin physical therapy 3 times a week for 6 weeks to work on left lower extremity range of motion and strengthening.  Patient agrees with plan and will follow up in 6 weeks.  Follow-Up Instructions: No follow-ups on file.   Orders:  No orders of the defined types were placed in this encounter.  No orders of the defined types were placed in this encounter.   Imaging: No results found.  PMFS History: Patient Active Problem List   Diagnosis Date Noted  . Asthma with  acute exacerbation 12/08/2017  . Seasonal allergic rhinitis due to pollen 07/06/2016  . Mild persistent asthma without complication 08/65/7846  . History of hypertension 07/06/2016  . Wheezing 03/19/2015  . Other allergic rhinitis 03/19/2015  . Equinus deformity of foot, acquired 02/18/2015  . Metatarsalgia of both feet 02/14/2014  . Metatarsal deformity 02/14/2014  . Acquired bilateral pes cavus 02/14/2014   Past Medical History:  Diagnosis Date  . Asthma   . Hypertension     History reviewed. No pertinent family history.  Past Surgical History:  Procedure Laterality Date  . RFA procedure     Social History   Occupational History  . Not on file  Tobacco Use  . Smoking status: Never Smoker  . Smokeless tobacco: Never Used  Substance and Sexual Activity  . Alcohol use: Yes    Alcohol/week: 0.0 standard drinks  . Drug use: No  . Sexual activity: Not on file

## 2019-02-12 MED ORDER — METHOCARBAMOL 500 MG PO TABS: 500.0000 mg | ORAL_TABLET | Freq: Three times a day (TID) | ORAL | 0 refills | Status: DC | PRN

## 2019-02-12 MED ORDER — MELOXICAM 15 MG PO TABS: 15.0000 mg | ORAL_TABLET | Freq: Every day | ORAL | 0 refills | Status: DC

## 2019-02-15 NOTE — Addendum Note (Signed)
Addended byLaurann Montana on: 02/15/2019 11:11 AM   Modules accepted: Orders

## 2019-03-03 ENCOUNTER — Ambulatory Visit: Payer: Federal, State, Local not specified - PPO | Admitting: Physical Therapy

## 2019-03-07 ENCOUNTER — Other Ambulatory Visit: Payer: Self-pay | Admitting: Orthopedic Surgery

## 2019-03-07 NOTE — Telephone Encounter (Signed)
Please advise. Thanks.  

## 2019-03-22 ENCOUNTER — Ambulatory Visit (INDEPENDENT_AMBULATORY_CARE_PROVIDER_SITE_OTHER): Admitting: Orthopedic Surgery

## 2019-03-22 ENCOUNTER — Other Ambulatory Visit: Payer: Self-pay

## 2019-03-22 ENCOUNTER — Encounter: Payer: Self-pay | Admitting: Orthopedic Surgery

## 2019-03-22 DIAGNOSIS — S83512D Sprain of anterior cruciate ligament of left knee, subsequent encounter: Secondary | ICD-10-CM

## 2019-03-23 ENCOUNTER — Encounter: Payer: Self-pay | Admitting: Orthopedic Surgery

## 2019-03-23 NOTE — Progress Notes (Signed)
   Post-Op Visit Note   Patient: Connie Zavala           Date of Birth: January 15, 1966           MRN: [id number] Visit Date: 03/22/2019 PCP: System, Pcp Not In   Assessment & Plan:  Chief Complaint:  Chief Complaint  Patient presents with  . Left Knee - Follow-up   Visit Diagnoses:  1. Rupture of anterior cruciate ligament of left knee, subsequent encounter     Plan: Connie Zavala is a patient is now about 4 and half months out left knee ACL reconstruction.  She has been doing reasonably well but has not started therapy yet.  Some issue with therapy around that holidays.  On exam she has good range of motion but some quad atrophy.  Graft is stable.  She is some therapy to really work on getting that leg stronger and I think that will help her pain.  We will get her started over today or this week with [facility] physical therapy.  She tried our place but did not have success.  I will see her back in about 8 weeks for final check.  Follow-Up Instructions: No follow-ups on file.   Orders:  No orders of the defined types were placed in this encounter.  No orders of the defined types were placed in this encounter.   Imaging: No results found.  PMFS History: Patient Active Problem List   Diagnosis Date Noted  . Asthma with acute exacerbation 12/08/2017  . Seasonal allergic rhinitis due to pollen 07/06/2016  . Mild persistent asthma without complication 07/06/2016  . History of hypertension 07/06/2016  . Wheezing 03/19/2015  . Other allergic rhinitis 03/19/2015  . Equinus deformity of foot, acquired 02/18/2015  . Metatarsalgia of both feet 02/14/2014  . Metatarsal deformity 02/14/2014  . Acquired bilateral pes cavus 02/14/2014   Past Medical History:  Diagnosis Date  . Asthma   . Hypertension     History reviewed. No pertinent family history.  Past Surgical History:  Procedure Laterality Date  . RFA procedure     Social History   Occupational History  . Not on file  Tobacco  Use  . Smoking status: Never Smoker  . Smokeless tobacco: Never Used  Substance and Sexual Activity  . Alcohol use: Yes    Alcohol/week: 0.0 standard drinks  . Drug use: No  . Sexual activity: Not on file

## 2019-03-24 ENCOUNTER — Telehealth: Payer: Self-pay | Admitting: Radiology

## 2019-03-24 DIAGNOSIS — M25561 Pain in right knee: Secondary | ICD-10-CM

## 2019-03-24 NOTE — Telephone Encounter (Signed)
Referral made for upstairs.

## 2019-03-24 NOTE — Telephone Encounter (Signed)
Dana called, they cannot see this patient at [facility].  They are not contracted with [facility].  You will need to send elsewhere.

## 2019-03-24 NOTE — Addendum Note (Signed)
Addended byPrescott Parma on: 03/24/2019 11:53 AM   Modules accepted: Orders

## 2019-03-30 ENCOUNTER — Other Ambulatory Visit: Payer: Self-pay | Admitting: Surgical

## 2019-03-30 NOTE — Telephone Encounter (Signed)
Please advise. Thanks.  

## 2019-03-31 ENCOUNTER — Ambulatory Visit: Payer: Federal, State, Local not specified - PPO | Admitting: Physical Therapy

## 2019-04-04 ENCOUNTER — Telehealth: Payer: Self-pay | Admitting: Orthopedic Surgery

## 2019-04-04 NOTE — Telephone Encounter (Signed)
Patient called asked if she can have (PT) in [facility] Monday, Thursday and Friday from  The hours of 1:00pm-3:00pm. If not in [place] then at [facility] in [place]. The number to contact patient is [phone]

## 2019-04-04 NOTE — Telephone Encounter (Signed)
Patient canceled and no showed.

## 2019-04-04 NOTE — Telephone Encounter (Signed)
Is patient not scheduled to see you guys? I thought I had put in referral for her for upstairs PT

## 2019-04-05 ENCOUNTER — Telehealth: Payer: Self-pay | Admitting: Orthopedic Surgery

## 2019-04-05 NOTE — Telephone Encounter (Signed)
Tried calling patient. Goes straight to greeting that states VM not set up.

## 2019-04-05 NOTE — Telephone Encounter (Signed)
Patient called.   Repeat call. Refer to last message from [place].

## 2019-04-05 NOTE — Telephone Encounter (Signed)
Tried calling patient to discuss per Dr Alfonso Patten note below, No answer, no VM has been set up to be able to LM.

## 2019-04-05 NOTE — Telephone Encounter (Signed)
Please advise what you want me to do for patient regarding PT. [facility] would not accept her insurance, patient was scheduled to be seen upstairs in PT but she cancelled first appt and no showed second so not sure that they want to reschedule her upstairs.

## 2019-04-05 NOTE — Telephone Encounter (Signed)
While I would say we have done all we can.  If she does not show up for appointments then that is the way to get his.  Her knee is stable she just needs to get on a stationary bike if she does not want to do formal physical therapy.  Something else may be going on this is an atypical situation.

## 2019-04-06 ENCOUNTER — Telehealth: Payer: Self-pay | Admitting: Orthopedic Surgery

## 2019-04-06 NOTE — Telephone Encounter (Signed)
Patient called asked for a call back. Patient said she will be waiting to receive call.  The number to contact patient is [phone]

## 2019-04-06 NOTE — Telephone Encounter (Signed)
Tried calling VM greeting states VM not set up

## 2019-04-07 NOTE — Telephone Encounter (Signed)
Tried calling, goes straight to greeting that states patient has a voicemail that has not been set up yet

## 2019-04-08 ENCOUNTER — Other Ambulatory Visit: Payer: Self-pay | Admitting: Surgical

## 2019-04-08 ENCOUNTER — Other Ambulatory Visit: Payer: Self-pay | Admitting: Orthopedic Surgery

## 2019-04-10 NOTE — Telephone Encounter (Signed)
Pls advise.  

## 2019-04-11 ENCOUNTER — Ambulatory Visit: Payer: Federal, State, Local not specified - PPO | Attending: Orthopedic Surgery | Admitting: Physical Therapy

## 2019-04-11 ENCOUNTER — Encounter: Payer: Self-pay | Admitting: Physical Therapy

## 2019-04-11 ENCOUNTER — Other Ambulatory Visit: Payer: Self-pay

## 2019-04-11 DIAGNOSIS — M25662 Stiffness of left knee, not elsewhere classified: Secondary | ICD-10-CM | POA: Insufficient documentation

## 2019-04-11 DIAGNOSIS — R262 Difficulty in walking, not elsewhere classified: Secondary | ICD-10-CM | POA: Diagnosis present

## 2019-04-11 DIAGNOSIS — R2689 Other abnormalities of gait and mobility: Secondary | ICD-10-CM

## 2019-04-11 DIAGNOSIS — M25562 Pain in left knee: Secondary | ICD-10-CM | POA: Diagnosis present

## 2019-04-11 DIAGNOSIS — G8929 Other chronic pain: Secondary | ICD-10-CM | POA: Diagnosis present

## 2019-04-11 DIAGNOSIS — M6281 Muscle weakness (generalized): Secondary | ICD-10-CM | POA: Diagnosis present

## 2019-04-11 NOTE — Therapy (Signed)
[facility] [address]  [address] [place], [place], [address] Phone: [phone]   Fax:  [phone]  Physical Therapy Evaluation  Patient Details  Name: Connie Zavala MRN: [id number] Date of Birth: Sep 28, 1965 Referring Provider (PT): Lenice Pressman, MD   Encounter Date: 04/11/2019  PT End of Session - 04/11/19 0839    Visit Number  1    Number of Visits  16    Date for PT Re-Evaluation  06/06/19    Authorization Type  Federal [employer] - VL: 50    Authorization - Number of Visits  50    PT Start Time  0839    PT Stop Time  0934    PT Time Calculation (min)  55 min    Activity Tolerance  Patient tolerated treatment well    Behavior During Therapy  [place] for tasks assessed/performed       Past Medical History:  Diagnosis Date  . Asthma   . Hypertension     Past Surgical History:  Procedure Laterality Date  . RFA procedure      There were no vitals filed for this visit.   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 Imaging at the time revealed a closed fracture of the L tibial plateau as well as an ACL rupture. Underwent ACL reconstruction surgery in Aug 2020 but rehab not started due to waiting for worker's comp authorization. Went back to work in early October. Continues to have pain and stiffness, as well as difficulty getting on/off forklift.    Pertinent History  L ACL repair - 11/07/18; fall with closed L tibial plateau fracture & ACL rupture - 08/28/18    Limitations  Standing;Walking    How long can you stand comfortably?  <1 hr    How long can you walk comfortably?  <1 hr    Patient Stated Goals  "to get my knee back to where I can bend it fully and not limp anymore"    Currently in Pain?  Yes    Pain Score   7     Pain Location  Knee    Pain Orientation  Left;Anterior    Pain Descriptors / Indicators  Throbbing   "pulling"   Pain Type  Chronic pain;Surgical pain    Pain Radiating Towards  n/a    Pain Onset  More than a month ago   June 2020   Pain Frequency  Constant    Aggravating Factors   unpredictable    Pain Relieving Factors  muscle relaxants (currently out)    Effect of Pain on Daily Activities  "paralyzes the knee at times" making walking difficult         [facility] PT Assessment - 04/11/19 0839      Assessment   Medical Diagnosis  L knee pain s/p ACL reconstruction    Referring Provider (PT)  Lenice Pressman, MD    Onset Date/Surgical Date  11/07/18   initial injury 08/28/18   Next MD Visit  04/18/18    Prior Therapy  none      Precautions   Precautions  None      Balance Screen   Has the patient fallen in the past 6 months  No    Has the patient had a decrease in activity level because of a fear of falling?   Yes    Is the patient reluctant to leave their home because of a fear of falling?   No      Home Environment   Living Environment  Private residence    Type of [place] Access  Level entry    [place]  Two level;Able to live on main level with bedroom/bathroom      Prior Function   Level of Independence  Independent    Vocation  Full time employment    Geophysicist/field seismologist - [employer]    Leisure  relax; walking 5 miles at least 1x/wk; dancing; shopping; working in the yard      Cognition   Overall Cognitive Status  Within Functional Limits for tasks assessed      Observation/Other Assessments   Focus on Therapeutic Outcomes (FOTO)   Knee - 52% (48% limitation); Predicted 67% (33% limitation)      ROM / Strength   AROM / PROM / Strength  AROM;Strength      AROM   AROM Assessment Site  Knee    Right/Left Knee  Right;Left    Right Knee Extension  -4    Right Knee Flexion  135    Left Knee Extension  -4    Left Knee Flexion   108      Strength   Strength Assessment Site  Hip;Knee    Right/Left Hip  Right;Left    Right Hip Flexion  5/5    Right Hip Extension  4+/5    Right Hip External Rotation   4/5    Right Hip Internal Rotation  4+/5    Right Hip ABduction  4+/5    Right Hip ADduction  4/5    Left Hip Flexion  4/5    Left Hip Extension  4-/5    Left Hip External Rotation  4-/5    Left Hip Internal Rotation  4-/5    Left Hip ABduction  4-/5    Left Hip ADduction  3+/5    Right/Left Knee  Right;Left    Right Knee Flexion  5/5    Right Knee Extension  5/5    Left Knee Flexion  4/5    Left Knee Extension  4/5      Flexibility   Soft Tissue Assessment /Muscle Length  yes    Hamstrings  WFL    Quadriceps  mod tight L>R    ITB  mild tight L    Piriformis  WFL      Palpation   Patella mobility  restricted all directions L knee      Ambulation/Gait   Assistive device  None    Gait Pattern  Step-through pattern;Decreased hip/knee flexion - left;Decreased weight shift to left;Decreased stance time - left;Antalgic                Objective measurements completed on examination: See above findings.      [facility] Adult PT Treatment/Exercise - 04/11/19 0839      Exercises   Exercises  Knee/Hip      Knee/Hip Exercises: Stretches   Quad Stretch  Left;30 seconds;2 reps    Quad Stretch Limitations  prone with strap    ITB Stretch  Left;30 seconds;2 reps    ITB Stretch Limitations  supine with strap      Knee/Hip Exercises:  Supine   Heel Slides  Left;AAROM;5 reps    Heel Slides Limitations  supine/long sitting with strap    Straight Leg Raises  Left;5 reps;Strengthening    Straight Leg Raises Limitations  cues for quad set prior to initiation of lift    Patellar Mobs  Instructed patient in L patellar mobs in all directions - pt having difficulty creating movement of patella (tending to roll skin over edge) on return demonstration - will likely require further training.              PT Education - 04/11/19 0933    Education Details  PT eval findings, anticipated POC & intial HEP    Person(s) Educated  Patient    Methods  Explanation;Demonstration;Verbal cues;Tactile cues;Handout    Comprehension  Verbalized understanding;Returned demonstration;Verbal cues required;Tactile cues required;Need further instruction       PT Short Term Goals - 04/11/19 0934      PT SHORT TERM GOAL #1   Title  Patient will be independent with initial HEP    Status  New    Target Date  05/02/19      PT SHORT TERM GOAL #2   Title  Patient will increase L knee flexion to >/= 120 dg  to promote more normal gait and stair mechanics    Status  New    Target Date  05/09/19      PT SHORT TERM GOAL #3   Title  Patient will ambulate with normal gait pattern    Status  New    Target Date  06/06/19        PT Long Term Goals - 04/11/19 0934      PT LONG TERM GOAL #1   Title  Patient will be independent with ongoing/advanced HEP    Status  New    Target Date  06/06/19      PT LONG TERM GOAL #2   Title  Patient will increase L knee flexion to >/= 130 dg to allow for normal gait and stair mechanics    Status  New    Target Date  06/06/19      PT LONG TERM GOAL #3   Title  Patient will demonstrate improved L hip and knee strength to >/= 4+/5 for improved stability    Status  New    Target Date  06/06/19      PT LONG TERM GOAL #4   Title  Patient will negotiate stairs reciprocally with normal step pattern w/o limitation due to L knee pain or weakness    Status  New    Target Date  06/06/19      PT LONG TERM GOAL #5   Title  Patient to report ability to perform ADLs, household and work-related tasks including climbing in/out of forklift without increased pain    Status  New    Target Date  06/06/19             Plan - 04/11/19 0934    Clinical Impression Statement  Sonakshi is a 54 y/o female who presents to OP PT for chronic L knee pain s/p L ACL repair on  11/07/2018. She reports initial injury to L knee occurred on 08/28/2018 from a fall at work which caused a closed tibial plateau fracture and the ACL rupture. Rehab was ordered following the initial injury and again after surgery but start of PT was delayed due to worker's comp denying coverage. She is now ~5 months post-surgery with ongoing  L knee pain, LOM in L knee and LE weakness which contributes to abnormal gait pattern and decreased activity tolerance at home and work. Calyn will benefit from skilled PT intervention to address the above deficits, reduce pain and restore functional strength to allow for improved knee stability with daily home and work tasks.    Personal Factors and Comorbidities  Time since onset of injury/illness/exacerbation;Past/Current Experience;Comorbidity 3+    Comorbidities  Metatarsalgia of both feet, B pes cavus & Equinus deformity of feet, HTN, asthma    Examination-Activity Limitations  Bend;Lift;Locomotion Level;Sit;Stand;Squat;Transfers    Examination-Participation Restrictions  Other   work restricted to 8 hr shifts, 5 days per week   Stability/Clinical Decision Making  Stable/Uncomplicated    Clinical Decision Making  Low    Rehab Potential  Good    PT Frequency  2x / week    PT Duration  8 weeks    PT Treatment/Interventions  ADLs/Self Care Home Management;Cryotherapy;Electrical Stimulation;Iontophoresis 4mg /ml Dexamethasone;Moist Heat;Gait training;Stair training;Functional mobility training;Therapeutic activities;Therapeutic exercise;Balance training;Neuromuscular re-education;Patient/family education;Manual techniques;Passive range of motion;Dry needling;Taping;Joint Manipulations    PT Next Visit Plan  Review initial HEP; L knee flexion ROM; LE strengthening    PT Home Exercise Plan  04/11/2019 - patellar mobs, quad & ITB stretches, SLR    Consulted and Agree with Plan of Care  Patient       Patient will benefit from skilled therapeutic intervention in  order to improve the following deficits and impairments:  Abnormal gait, Decreased activity tolerance, Decreased balance, Decreased endurance, Decreased mobility, Decreased range of motion, Decreased scar mobility, Decreased strength, Difficulty walking, Increased fascial restricitons, Increased muscle spasms, Impaired perceived functional ability, Impaired flexibility, Pain  Visit Diagnosis: Chronic pain of left knee  Stiffness of left knee, not elsewhere classified  Muscle weakness (generalized)  Other abnormalities of gait and mobility  Difficulty in walking, not elsewhere classified     Problem List Patient Active Problem List   Diagnosis Date Noted  . Asthma with acute exacerbation 12/08/2017  . Seasonal allergic rhinitis due to pollen 07/06/2016  . Mild persistent asthma without complication 07/06/2016  . History of hypertension 07/06/2016  . Wheezing 03/19/2015  . Other allergic rhinitis 03/19/2015  . Equinus deformity of foot, acquired 02/18/2015  . Metatarsalgia of both feet 02/14/2014  . Metatarsal deformity 02/14/2014  . Acquired bilateral pes cavus 02/14/2014    14/04/2013, PT, MPT 04/11/2019, 12:37 PM  [facility] [address]  [address] [place], [place], [place] Phone: [phone]   Fax:  [phone]  Name: Connie Zavala MRN: Letta Median Date of Birth: 09-Jun-1965

## 2019-04-12 ENCOUNTER — Ambulatory Visit: Payer: Federal, State, Local not specified - PPO

## 2019-04-12 DIAGNOSIS — G8929 Other chronic pain: Secondary | ICD-10-CM

## 2019-04-12 DIAGNOSIS — M6281 Muscle weakness (generalized): Secondary | ICD-10-CM

## 2019-04-12 DIAGNOSIS — M25562 Pain in left knee: Secondary | ICD-10-CM | POA: Diagnosis not present

## 2019-04-12 DIAGNOSIS — M25662 Stiffness of left knee, not elsewhere classified: Secondary | ICD-10-CM

## 2019-04-12 DIAGNOSIS — R2689 Other abnormalities of gait and mobility: Secondary | ICD-10-CM

## 2019-04-12 NOTE — Therapy (Signed)
[facility] [address]  [place], [place], [address] Phone: [phone]   Fax:  [phone]  Physical Therapy Treatment  Patient Details  Name: Connie Zavala MRN: [id number] Date of Birth: [birth year] Referring Provider (PT): Josiah Lobo, MD   Encounter Date: 04/12/2019  PT End of Session - 04/12/19 1322    Visit Number  2    Number of Visits  16    Date for PT Re-Evaluation  06/06/19    Authorization Type  Federal BCBS - VL: 50    Authorization - Number of Visits  50    PT Start Time  1311    PT Stop Time  1415    PT Time Calculation (min)  64 min    Activity Tolerance  Patient tolerated treatment well    Behavior During Therapy  WFL for tasks assessed/performed       Past Medical History:  Diagnosis Date  . Asthma   . Hypertension     Past Surgical History:  Procedure Laterality Date  . RFA procedure      There were no vitals filed for this visit.  Subjective Assessment - 04/12/19 1316    Subjective  Pt. reporting difficulty with HEP "strap exercises" as she has not found a substitute for the strap.    Pertinent History  L ACL repair - 11/07/18; fall with closed L tibial plateau fracture & ACL rupture - 08/28/18    Patient Stated Goals  "to get my knee back to where I can bend it fully and not limp anymore"    Currently in Pain?  No/denies    Pain Score  0-No pain   pain rising to 10/10 at L medial knee when "putting pressure through it.   Pain Location  Knee    Pain Orientation  Left;Anterior;Medial    Pain Descriptors / Indicators  Throbbing    Pain Type  Chronic pain;Surgical pain    Pain Onset  More than a month ago    Pain Frequency  Constant                       [facility] Adult PT Treatment/Exercise - 04/12/19 0001      Knee/Hip Exercises: Stretches   Sports administrator  Left;3 reps;30 seconds    Quad Stretch Limitations  prone with strap    Hip Flexor Stretch  Left;3 reps;30  seconds    Hip Flexor Stretch Limitations  mod thomas position     ITB Stretch  Left;30 seconds;2 reps   cues required for hold times and positioning    ITB Stretch Limitations  supine with strap      Knee/Hip Exercises: Aerobic   Recumbent Bike  Lvl 1, 6 min       Knee/Hip Exercises: Standing   Functional Squat  10 reps;3 seconds   pt. noting medial knee pain on last few reps    Functional Squat Limitations  counter support      Knee/Hip Exercises: Supine   Heel Slides  Left;AAROM;10 reps    Heel Slides Limitations  supine strap assistance    Bridges with Ball Squeeze  Both;10 reps   + adduction ball squeeze    Straight Leg Raises  Left;10 reps;Strengthening;2 sets    Straight Leg Raises Limitations  cues for quad contraction before each rep    Knee Flexion  Left;AAROM;10 reps    Knee Flexion Limitations  + strap; heels  elevated on peanut p-ball       Knee/Hip Exercises: Sidelying   Hip ABduction  Left;10 reps    Hip ABduction Limitations  cues for full sidelying positioning       Modalities   Modalities  Vasopneumatic      Vasopneumatic   Number Minutes Vasopneumatic   15 minutes    Vasopnuematic Location   Knee   L   Vasopneumatic Pressure  Low    Vasopneumatic Temperature   coldest temp      Manual Therapy   Manual Therapy  Joint mobilization    Manual therapy comments  supine     Joint Mobilization  L patellar mobs all directions for improved ROM               PT Short Term Goals - 04/12/19 1556      PT SHORT TERM GOAL #1   Title  Patient will be independent with initial HEP    Status  On-going    Target Date  05/02/19      PT SHORT TERM GOAL #2   Title  Patient will increase L knee flexion to >/= 120 dg  to promote more normal gait and stair mechanics    Status  On-going    Target Date  05/09/19      PT SHORT TERM GOAL #3   Title  Patient will ambulate with normal gait pattern    Status  On-going    Target Date  06/06/19        PT Long  Term Goals - 04/12/19 1557      PT LONG TERM GOAL #1   Title  Patient will be independent with ongoing/advanced HEP    Status  On-going      PT LONG TERM GOAL #2   Title  Patient will increase L knee flexion to >/= 130 dg to allow for normal gait and stair mechanics    Status  On-going      PT LONG TERM GOAL #3   Title  Patient will demonstrate improved L hip and knee strength to >/= 4+/5 for improved stability    Status  On-going      PT LONG TERM GOAL #4   Title  Patient will negotiate stairs reciprocally with normal step pattern w/o limitation due to L knee pain or weakness    Status  On-going      PT LONG TERM GOAL #5   Title  Patient to report ability to perform ADLs, household and 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 primary complaint today to start session is L medial knee "ache" pain when she puts weight through L LE when walking.  0/10 pain when sitting or with HEP activities.  Tolerated all supine level L knee flexion ROM activities well today with cueing required during SLR for quad set prior to motion.  Did have onset of L knee pain after "mini" squat at counter which subsided to low-level ache pain with sitting rest thus applied ice/compression to L knee to reduce post-exercise swelling.  May consider trial of K-taping to L knee in coming sessions to improve pt. tolerance for standing therex as pt. was open to this today.    Comorbidities  Metatarsalgia of both feet, B pes cavus & Equinus deformity of feet,  HTN, asthma    Rehab Potential  Good    PT Treatment/Interventions  ADLs/Self Care Home Management;Cryotherapy;Electrical Stimulation;Iontophoresis 4mg /ml Dexamethasone;Moist Heat;Gait training;Stair training;Functional mobility training;Therapeutic activities;Therapeutic exercise;Balance training;Neuromuscular  re-education;Patient/family education;Manual techniques;Passive range of motion;Dry needling;Taping;Joint Manipulations    PT Next Visit Plan  L knee flexion ROM; LE strengthening    PT Home Exercise Plan  04/11/2019 - patellar mobs, quad & ITB stretches, SLR    Consulted and Agree with Plan of Care  Patient       Patient will benefit from skilled therapeutic intervention in order to improve the following deficits and impairments:  Abnormal gait, Decreased activity tolerance, Decreased balance, Decreased endurance, Decreased mobility, Decreased range of motion, Decreased scar mobility, Decreased strength, Difficulty walking, Increased fascial restricitons, Increased muscle spasms, Impaired perceived functional ability, Impaired flexibility, Pain  Visit Diagnosis: Chronic pain of left knee  Stiffness of left knee, not elsewhere classified  Muscle weakness (generalized)  Other abnormalities of gait and mobility     Problem List Patient Active Problem List   Diagnosis Date Noted  . Asthma with acute exacerbation 12/08/2017  . Seasonal allergic rhinitis due to pollen 07/06/2016  . Mild persistent asthma without complication 07/06/2016  . History of hypertension 07/06/2016  . Wheezing 03/19/2015  . Other allergic rhinitis 03/19/2015  . Equinus deformity of foot, acquired 02/18/2015  . Metatarsalgia of both feet 02/14/2014  . Metatarsal deformity 02/14/2014  . Acquired bilateral pes cavus 02/14/2014     14/04/2013, PTA 04/12/19 4:09 PM   [facility] [address]  [address] [place], [place], [place] Phone: [phone]   Fax:  [phone]  Name: MELIZZA KANODE MRN: Letta Median Date of Birth: 05/04/1965

## 2019-04-18 ENCOUNTER — Ambulatory Visit: Payer: Federal, State, Local not specified - PPO | Attending: Orthopedic Surgery | Admitting: Physical Therapy

## 2019-04-18 ENCOUNTER — Other Ambulatory Visit: Payer: Self-pay

## 2019-04-18 ENCOUNTER — Encounter: Payer: Self-pay | Admitting: Physical Therapy

## 2019-04-18 DIAGNOSIS — M25562 Pain in left knee: Secondary | ICD-10-CM | POA: Insufficient documentation

## 2019-04-18 DIAGNOSIS — M25662 Stiffness of left knee, not elsewhere classified: Secondary | ICD-10-CM | POA: Diagnosis present

## 2019-04-18 DIAGNOSIS — M6281 Muscle weakness (generalized): Secondary | ICD-10-CM | POA: Diagnosis present

## 2019-04-18 DIAGNOSIS — R262 Difficulty in walking, not elsewhere classified: Secondary | ICD-10-CM | POA: Insufficient documentation

## 2019-04-18 DIAGNOSIS — R2689 Other abnormalities of gait and mobility: Secondary | ICD-10-CM | POA: Diagnosis present

## 2019-04-18 DIAGNOSIS — G8929 Other chronic pain: Secondary | ICD-10-CM

## 2019-04-18 NOTE — Therapy (Signed)
[facility] [address]  [address] [place], [place], [address] Phone: [phone]   Fax:  [phone]  Physical Therapy Treatment  Patient Details  Name: Connie Zavala MRN: [id number] Date of Birth: [birth year] Referring Provider (PT): Lenice Pressman, MD   Encounter Date: 04/18/2019  PT End of Session - 04/18/19 1317    Visit Number  3    Number of Visits  16    Date for PT Re-Evaluation  06/06/19    Authorization Type  Federal BC[employer] - VL: 50    Authorization - Number of Visits  50    PT Start Time  1317    PT Stop Time  1409    PT Time Calculation (min)  52 min    Activity Tolerance  Patient tolerated treatment well    Behavior During Therapy  WFL for tasks assessed/performed       Past Medical History:  Diagnosis Date  . Asthma   . Hypertension     Past Surgical History:  Procedure Laterality Date  . RFA procedure      There were no vitals filed for this visit.  Subjective Assessment - 04/18/19 1319    Subjective  Pt reporting her knee continues to swell on her while at work, but ices when she gets home.    Pertinent History  L ACL repair - 11/07/18; fall with closed L tibial plateau fracture & ACL rupture - 08/28/18    Patient Stated Goals  "to get my knee back to where I can bend it fully and not limp anymore"    Currently in Pain?  Yes    Pain Score  5     Pain Location  Knee    Pain Orientation  Left;Anterior;Medial    Pain Descriptors / Indicators  --   "very intense"   Pain Frequency  Intermittent                       [facility] Adult PT Treatment/Exercise - 04/18/19 1317      Exercises   Exercises  Knee/Hip      Knee/Hip Exercises: Aerobic   Recumbent Bike  L1 x 6 min      Knee/Hip Exercises: Standing   Heel Raises  Both;10 reps;5 seconds    Terminal Knee Extension  Left;10 reps    Terminal Knee Extension Limitations  5" hold with ball into wall    Functional Squat Limitations   deferred d/t increased pain at patellar tendon      Knee/Hip Exercises: Supine   Short Arc Quad Sets  Left;15 reps;Strengthening    Short Arc Quad Sets Limitations  + hip adduction ball squeeze for VMO activation    Straight Leg Raise with External Rotation  Left;10 reps;Strengthening    Straight Leg Raise with External Rotation Limitations  cues for quad set prior to initiation of lift    Knee Extension  Both;10 reps   5" hold   Knee Extension Limitations  quad + glute set isometric pushing into peanut ball    Knee Flexion  Left;10 reps;2 sets;AROM;AAROM    Knee Flexion Limitations  HS curls with heels on peanut ball; strap for slight overpressure on second set + PT MWM fro inferior patellar glide      Knee/Hip Exercises: Sidelying   Hip ABduction  Left;10 reps;AROM    Hip ABduction Limitations  cues to avoid pulling leg fwd into flexion  Hip ADduction  Left;10 reps;AROM;Strengthening      Modalities   Modalities  Vasopneumatic      Vasopneumatic   Number Minutes Vasopneumatic   10 minutes    Vasopnuematic Location   Knee   L   Vasopneumatic Pressure  Medium    Vasopneumatic Temperature   34 dg      Manual Therapy   Manual Therapy  Soft tissue mobilization;Myofascial release;Other (comment)    Soft tissue mobilization  L gracilis, lateral quads and ITB    Myofascial Release  L pin & stretch to gracilis, lateral quads and ITB    Other Manual Therapy  Instructed patient is use of rolling pin for self-STM to L hip adductors, quads & ITB               PT Short Term Goals - 04/12/19 1556      PT SHORT TERM GOAL #1   Title  Patient will be independent with initial HEP    Status  On-going    Target Date  05/02/19      PT SHORT TERM GOAL #2   Title  Patient will increase L knee flexion to >/= 120 dg  to promote more normal gait and stair mechanics    Status  On-going    Target Date  05/09/19      PT SHORT TERM GOAL #3   Title  Patient will ambulate with normal gait  pattern    Status  On-going    Target Date  06/06/19        PT Long Term Goals - 04/12/19 1557      PT LONG TERM GOAL #1   Title  Patient will be independent with ongoing/advanced HEP    Status  On-going      PT LONG TERM GOAL #2   Title  Patient will increase L knee flexion to >/= 130 dg to allow for normal gait and stair mechanics    Status  On-going      PT LONG TERM GOAL #3   Title  Patient will demonstrate improved L hip and knee strength to >/= 4+/5 for improved stability    Status  On-going      PT LONG TERM GOAL #4   Title  Patient will negotiate stairs reciprocally with normal step pattern w/o limitation due to L knee pain or weakness    Status  On-going      PT LONG TERM GOAL #5   Title  Patient to report ability to perform ADLs, household and 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 Increased tension and ttp noted in gracilis as well as VL which was addressed with STM/MFR, with patient instructed in use of rolling pin for ongoing self-STM at home. Strengthening continuing to focus on VMO activation to improve patella tracking as patient noting patella feels like it is "not sliding smoothly in the groove". Treatment concluded with vasopnuematic compression for ongoing pain and edema.    Personal Factors and Comorbidities  Time since onset of injury/illness/exacerbation;Past/Current Experience;Comorbidity 3+    Comorbidities  Metatarsalgia of both feet, B pes cavus & Equinus deformity of feet, HTN, asthma    Examination-Activity Limitations  Bend;Lift;Locomotion Level;Sit;Stand;Squat;Transfers  Examination-Participation Restrictions  Other   work restricted to 8 hr shifts, 5 days per week   Rehab Potential  Good    PT  Frequency  2x / week    PT Duration  8 weeks    PT Treatment/Interventions  ADLs/Self Care Home Management;Cryotherapy;Electrical Stimulation;Iontophoresis 4mg /ml Dexamethasone;Moist Heat;Gait training;Stair training;Functional mobility training;Therapeutic activities;Therapeutic exercise;Balance training;Neuromuscular re-education;Patient/family education;Manual techniques;Passive range of motion;Dry needling;Taping;Joint Manipulations    PT Next Visit Plan  L knee flexion ROM; LE strengthening    PT Home Exercise Plan  04/11/2019 - patellar mobs, quad & ITB stretches, SLR    Consulted and Agree with Plan of Care  Patient       Patient will benefit from skilled therapeutic intervention in order to improve the following deficits and impairments:  Abnormal gait, Decreased activity tolerance, Decreased balance, Decreased endurance, Decreased mobility, Decreased range of motion, Decreased scar mobility, Decreased strength, Difficulty walking, Increased fascial restricitons, Increased muscle spasms, Impaired perceived functional ability, Impaired flexibility, Pain  Visit Diagnosis: Chronic pain of left knee  Stiffness of left knee, not elsewhere classified  Muscle weakness (generalized)  Other abnormalities of gait and mobility  Difficulty in walking, not elsewhere classified     Problem List Patient Active Problem List   Diagnosis Date Noted  . Asthma with acute exacerbation 12/08/2017  . Seasonal allergic rhinitis due to pollen 07/06/2016  . Mild persistent asthma without complication 07/06/2016  . History of hypertension 07/06/2016  . Wheezing 03/19/2015  . Other allergic rhinitis 03/19/2015  . Equinus deformity of foot, acquired 02/18/2015  . Metatarsalgia of both feet 02/14/2014  . Metatarsal deformity 02/14/2014  . Acquired bilateral pes cavus 02/14/2014    14/04/2013, PT, MPT 04/18/2019, 4:29 PM  [facility] [address]  [address] [place], [place], [place] Phone: [phone]   Fax:  [phone]  Name: MAKYLIE RIVERE MRN: Letta Median Date of Birth: 09/02/1965

## 2019-04-19 ENCOUNTER — Encounter: Payer: Self-pay | Admitting: Orthopedic Surgery

## 2019-04-19 ENCOUNTER — Ambulatory Visit (INDEPENDENT_AMBULATORY_CARE_PROVIDER_SITE_OTHER): Admitting: Orthopedic Surgery

## 2019-04-19 ENCOUNTER — Ambulatory Visit: Payer: Federal, State, Local not specified - PPO

## 2019-04-19 DIAGNOSIS — M6281 Muscle weakness (generalized): Secondary | ICD-10-CM

## 2019-04-19 DIAGNOSIS — M25662 Stiffness of left knee, not elsewhere classified: Secondary | ICD-10-CM

## 2019-04-19 DIAGNOSIS — R262 Difficulty in walking, not elsewhere classified: Secondary | ICD-10-CM

## 2019-04-19 DIAGNOSIS — R2689 Other abnormalities of gait and mobility: Secondary | ICD-10-CM

## 2019-04-19 DIAGNOSIS — S83512D Sprain of anterior cruciate ligament of left knee, subsequent encounter: Secondary | ICD-10-CM | POA: Diagnosis not present

## 2019-04-19 DIAGNOSIS — G8929 Other chronic pain: Secondary | ICD-10-CM

## 2019-04-19 DIAGNOSIS — M25562 Pain in left knee: Secondary | ICD-10-CM

## 2019-04-19 NOTE — Therapy (Signed)
[facility] [address]  [place], [place], [address] Phone: [phone]   Fax:  [phone]  Physical Therapy Treatment  Patient Details  Name: Connie Zavala MRN: [id number] Date of Birth: 1965-12-19 Referring Provider (PT): Josiah Lobo, MD   Encounter Date: 04/19/2019  PT End of Session - 04/19/19 1318    Visit Number  4    Number of Visits  16    Date for PT Re-Evaluation  06/06/19    Authorization Type  Federal [employer] - VL: 68    Authorization - Number of Visits  50    PT Start Time  3154    PT Stop Time  1401    PT Time Calculation (min)  49 min    Activity Tolerance  Patient tolerated treatment well    Behavior During Therapy  WFL for tasks assessed/performed       Past Medical History:  Diagnosis Date  . Asthma   . Hypertension     Past Surgical History:  Procedure Laterality Date  . RFA procedure      There were no vitals filed for this visit.  Subjective Assessment - 04/19/19 1317    Subjective  Pt. reporting some benefit from manual massage last session.    Pertinent History  L ACL repair - 11/07/18; fall with closed L tibial plateau fracture & ACL rupture - 08/28/18    Patient Stated Goals  "to get my knee back to where I can bend it fully and not limp anymore"    Currently in Pain?  Yes    Pain Score  5     Pain Location  Knee    Pain Orientation  Left;Anterior;Medial    Pain Type  Chronic pain;Surgical pain    Pain Onset  More than a month ago    Pain Frequency  Intermittent    Multiple Pain Sites  No                       [facility] Adult PT Treatment/Exercise - 04/19/19 0001      Self-Care   Self-Care  Other Self-Care Comments    Other Self-Care Comments   Review of self-massage on groin and lateral L quad musculature with "roller stick"      Knee/Hip Exercises: Stretches   Control and instrumentation engineer reps;30 seconds    Quad Stretch Limitations  prone with strap      Knee/Hip Exercises: Aerobic   Nustep  Lvl 4, 6 min (UE/LE)      Knee/Hip Exercises: Standing   Terminal Knee Extension  Left;15 reps;Theraband    Theraband Level (Terminal Knee Extension)  Level 3 (Green)    Terminal Knee Extension Limitations  heavy cueing for standing green TB TKE with therapist anchoring band       Knee/Hip Exercises: Supine   Short Arc Quad Sets  Left;15 reps    Short Arc Quad Sets Limitations  + adduction ball squeeze     Straight Leg Raises  Left;15 reps    Straight Leg Raises Limitations  cues for quad contraction before each rep    Knee Extension  Both;10 reps   5" hold    Knee Extension Limitations  quad + glute set isometric pushing into peanut ball    Knee Flexion  Left;10 reps;AAROM;1 set    Knee Flexion Limitations  strap       Manual Therapy   Manual Therapy  Soft tissue mobilization;Other (comment);Taping    Soft tissue mobilization  L gracilis, lateral quads     Myofascial Release  TPR to L mid/distal gracilis, mid VL     Kinesiotex  IT consultant  L knee chondromalacia taping pattern (30% stretch all strips)    due to complaint of L medial knee pain               PT Short Term Goals - 04/12/19 1556      PT SHORT TERM GOAL #1   Title  Patient will be independent with initial HEP    Status  On-going    Target Date  05/02/19      PT SHORT TERM GOAL #2   Title  Patient will increase L knee flexion to >/= 120 dg  to promote more normal gait and stair mechanics    Status  On-going    Target Date  05/09/19      PT SHORT TERM GOAL #3   Title  Patient will ambulate with normal gait pattern    Status  On-going    Target Date  06/06/19        PT Long Term Goals - 04/12/19 1557      PT LONG TERM GOAL #1   Title  Patient will be independent with ongoing/advanced HEP    Status  On-going      PT LONG TERM GOAL #2   Title  Patient will increase L knee flexion to >/= 130 dg to allow for normal gait and  stair mechanics    Status  On-going      PT LONG TERM GOAL #3   Title  Patient will demonstrate improved L hip and knee strength to >/= 4+/5 for improved stability    Status  On-going      PT LONG TERM GOAL #4   Title  Patient will negotiate stairs reciprocally with normal step pattern w/o limitation due to L knee pain or weakness    Status  On-going      PT LONG TERM GOAL #5   Title  Patient to report ability to perform ADLs, household and 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  Notes some improvement after MT to L groin and thigh musculature last visit.  palpation revealed pt. with increased tone and with reported tenderness in distal Gracilis, L VL musculature thus addressed this with MT including STM/TPR.  Duration of session focused on quad/VMO strengthening to pt. tolerance with cueing required to encouraged TKE control with activities.    Comorbidities  Metatarsalgia of both feet, B pes cavus & Equinus deformity of feet, HTN, asthma    Rehab Potential  Good    PT Treatment/Interventions  ADLs/Self Care Home Management;Cryotherapy;Electrical Stimulation;Iontophoresis 4mg /ml Dexamethasone;Moist Heat;Gait training;Stair training;Functional mobility training;Therapeutic activities;Therapeutic exercise;Balance training;Neuromuscular re-education;Patient/family education;Manual techniques;Passive range of motion;Dry needling;Taping;Joint Manipulations    PT Next Visit Plan  L knee flexion ROM; LE strengthening    PT Home Exercise Plan  04/11/2019 - patellar mobs, quad & ITB stretches, SLR    Consulted and Agree with Plan of Care  Patient       Patient will benefit from skilled therapeutic intervention in order to improve the following deficits and impairments:  Abnormal gait, Decreased activity tolerance, Decreased balance, Decreased  endurance, Decreased mobility, Decreased range of motion, Decreased scar mobility, Decreased strength, Difficulty walking, Increased fascial restricitons, Increased muscle spasms, Impaired perceived functional ability, Impaired flexibility, Pain  Visit Diagnosis: Chronic pain of left knee  Stiffness of left knee, not elsewhere classified  Muscle weakness (generalized)  Other abnormalities of gait and mobility  Difficulty in walking, not elsewhere classified     Problem List Patient Active Problem List   Diagnosis Date Noted  . Asthma with acute exacerbation 12/08/2017  . Seasonal allergic rhinitis due to pollen 07/06/2016  . Mild persistent asthma without complication 07/06/2016  . History of hypertension 07/06/2016  . Wheezing 03/19/2015  . Other allergic rhinitis 03/19/2015  . Equinus deformity of foot, acquired 02/18/2015  . Metatarsalgia of both feet 02/14/2014  . Metatarsal deformity 02/14/2014  . Acquired bilateral pes cavus 02/14/2014    Kermit Balo, PTA 04/19/19 5:15 PM   [facility] Outpatient Rehabilitation [facility] [address]  [address] [place], [place], [address] Phone: [phone]   Fax:  [phone]  Name: Connie Zavala MRN: [id number] Date of Birth: 04/29/1965

## 2019-04-20 ENCOUNTER — Encounter: Payer: Federal, State, Local not specified - PPO | Admitting: Physical Therapy

## 2019-04-22 ENCOUNTER — Encounter: Payer: Self-pay | Admitting: Orthopedic Surgery

## 2019-04-22 NOTE — Progress Notes (Signed)
Office Visit Note   Patient: Connie Zavala           Date of Birth: [birth year]           MRN: [id number] Visit Date: 04/19/2019 Requested by: No referring provider defined for this encounter. PCP: System, Pcp Not In  Subjective: Chief Complaint  Patient presents with  . Left Knee - Pain, Follow-up    HPI: Patient is here for follow-up left knee.  Had ACL reconstruction 11/03/2018.  Doing well.  Doing therapy twice a week.  Doing bike and leg exercises.  She wants to walk on the track.  Takes Robaxin at home for symptoms.              ROS: All systems reviewed are negative as they relate to the chief complaint within the history of present illness.  Patient denies  fevers or chills.   Assessment & Plan: Visit Diagnoses:  1. Rupture of anterior cruciate ligament of left knee, subsequent encounter     Plan: Impression is improvement in left knee symptoms following rehab for strengthening.  She is only been doing the rehab for a short amount of time.  I think she needs to continue for the next couple of months just to get the leg stronger and I think her symptoms will improve.  Follow-up with me at that time for final check.  Overall the patient is doing well.  Follow-Up Instructions: Return in about 8 weeks (around 06/14/2019).   Orders:  No orders of the defined types were placed in this encounter.  No orders of the defined types were placed in this encounter.     Procedures: No procedures performed   Clinical Data: No additional findings.  Objective: Vital Signs: There were no vitals taken for this visit.  Physical Exam:   Constitutional: Patient appears well-developed HEENT:  Head: Normocephalic Eyes:EOM are normal Neck: Normal range of motion Cardiovascular: Normal rate Pulmonary/chest: Effort normal Neurologic: Patient is alert Skin: Skin is warm Psychiatric: Patient has normal mood and affect    Ortho Exam: Ortho exam demonstrates full active and  passive range of motion of the left knee.  No effusion.  She does have about a centimeter to 2 cm of quad atrophy left versus right.  Extensor mechanism is intact.  ACL stability is excellent and comparable to the right knee.  Specialty Comments:  No specialty comments available.  Imaging: No results found.   PMFS History: Patient Active Problem List   Diagnosis Date Noted  . Asthma with acute exacerbation 12/08/2017  . Seasonal allergic rhinitis due to pollen 07/06/2016  . Mild persistent asthma without complication 07/06/2016  . History of hypertension 07/06/2016  . Wheezing 03/19/2015  . Other allergic rhinitis 03/19/2015  . Equinus deformity of foot, acquired 02/18/2015  . Metatarsalgia of both feet 02/14/2014  . Metatarsal deformity 02/14/2014  . Acquired bilateral pes cavus 02/14/2014   Past Medical History:  Diagnosis Date  . Asthma   . Hypertension     History reviewed. No pertinent family history.  Past Surgical History:  Procedure Laterality Date  . RFA procedure     Social History   Occupational History  . Not on file  Tobacco Use  . Smoking status: Never Smoker  . Smokeless tobacco: Never Used  Substance and Sexual Activity  . Alcohol use: Yes    Alcohol/week: 0.0 standard drinks  . Drug use: No  . Sexual activity: Not on file

## 2019-04-25 ENCOUNTER — Other Ambulatory Visit: Payer: Self-pay

## 2019-04-25 ENCOUNTER — Ambulatory Visit: Payer: Federal, State, Local not specified - PPO

## 2019-04-25 DIAGNOSIS — M25562 Pain in left knee: Secondary | ICD-10-CM

## 2019-04-25 DIAGNOSIS — R262 Difficulty in walking, not elsewhere classified: Secondary | ICD-10-CM

## 2019-04-25 DIAGNOSIS — M6281 Muscle weakness (generalized): Secondary | ICD-10-CM

## 2019-04-25 DIAGNOSIS — M25662 Stiffness of left knee, not elsewhere classified: Secondary | ICD-10-CM

## 2019-04-25 DIAGNOSIS — G8929 Other chronic pain: Secondary | ICD-10-CM

## 2019-04-25 DIAGNOSIS — R2689 Other abnormalities of gait and mobility: Secondary | ICD-10-CM

## 2019-04-25 NOTE — Therapy (Signed)
[facility] [address]  [place], [place], [address] Phone: [phone]   Fax:  [phone]  Physical Therapy Treatment  Patient Details  Name: Connie Zavala MRN: [id number] Date of Birth: 1965-07-02 Referring Provider (PT): Josiah Lobo, MD   Encounter Date: 04/25/2019  PT End of Session - 04/25/19 1424    Visit Number  5    Number of Visits  16    Date for PT Re-Evaluation  06/06/19    Authorization Type  Federal BC[employer] - VL: 45    Authorization - Number of Visits  50    PT Start Time  1402    PT Stop Time  1446    PT Time Calculation (min)  44 min    Activity Tolerance  Patient tolerated treatment well    Behavior During Therapy  [facility] for tasks assessed/performed       Past Medical History:  Diagnosis Date  . Asthma   . Hypertension     Past Surgical History:  Procedure Laterality Date  . RFA procedure      There were no vitals filed for this visit.  Subjective Assessment - 04/25/19 1421    Subjective  Pt. reporting she has been having continued L knee pain with walking.    Pertinent History  L ACL repair - 11/07/18; fall with closed L tibial plateau fracture & ACL rupture - 08/28/18    Patient Stated Goals  "to get my knee back to where I can bend it fully and not limp anymore"    Currently in Pain?  Yes    Pain Score  5    L knee pain rising to 7/10 when walking   Pain Location  Knee    Pain Orientation  Left;Anterior;Medial    Pain Descriptors / Indicators  Aching   "tooth ache"   Pain Type  Chronic pain;Surgical pain    Pain Onset  More than a month ago    Pain Frequency  Intermittent    Aggravating Factors   walking    Multiple Pain Sites  No         [facility] PT Assessment - 04/25/19 0001      AROM   AROM Assessment Site  Knee    Right/Left Knee  Left    Left Knee Extension  -4    Left Knee Flexion  122                   [facility] Adult PT Treatment/Exercise - 04/25/19 0001       Knee/Hip Exercises: Stretches   Hip Flexor Stretch  Left;2 reps;30 seconds    Hip Flexor Stretch Limitations  mod thomas position    + strap    ITB Stretch  Left;30 seconds;2 reps    ITB Stretch Limitations  supine with strap      Knee/Hip Exercises: Aerobic   Tread Mill  Lvl 1.0, 30 sec    terminated due to poor L weight shift and reported pain      Knee/Hip Exercises: Supine   Quad Sets  Left;10 reps    Quad Sets Limitations  5" pillow squeeze     Short Arc [employer]  Left;15 reps    Short Arc Quad Sets Limitations  + adduction ball squeeze     Bridges with [employer]  Both;Strengthening   x 12 reps    Straight Leg Raises  Left;15 reps    Straight  Leg Raises Limitations  cues for quad contraction before each rep    Straight Leg Raise with External Rotation  Left;10 reps;Strengthening      Knee/Hip Exercises: Sidelying   Hip ABduction  Left   x 12 reps   Hip ABduction Limitations  cues to avoid hip flexion       Manual Therapy   Manual Therapy  Joint mobilization    Manual therapy comments  supine     Joint Mobilization  L patellar mobs all directions for improved ROM    Soft tissue mobilization  L gracilis, lateral quads - remains ttp with palpable TPs    Myofascial Release  TPR to L mid/distal gracilis, mid VL              PT Education - 04/25/19 1457    Education Details  HEP update:  bridge/adduction, bridge/abduction into red TB, sidelying clam shell (no resistance), quad set pillow squeeze supine    Person(s) Educated  Patient    Methods  Explanation;Demonstration;Verbal cues;Handout    Comprehension  Verbalized understanding;Returned demonstration;Verbal cues required       PT Short Term Goals - 04/25/19 1813      PT SHORT TERM GOAL #1   Title  Patient will be independent with initial HEP    Status  On-going    Target Date  05/02/19      PT SHORT TERM GOAL #2   Title  Patient will increase L knee flexion to >/= 120 dg  to promote more normal  gait and stair mechanics    Status  Achieved    Target Date  05/09/19      PT SHORT TERM GOAL #3   Title  Patient will ambulate with normal gait pattern    Status  On-going    Target Date  06/06/19        PT Long Term Goals - 04/12/19 1557      PT LONG TERM GOAL #1   Title  Patient will be independent with ongoing/advanced HEP    Status  On-going      PT LONG TERM GOAL #2   Title  Patient will increase L knee flexion to >/= 130 dg to allow for normal gait and stair mechanics    Status  On-going      PT LONG TERM GOAL #3   Title  Patient will demonstrate improved L hip and knee strength to >/= 4+/5 for improved stability    Status  On-going      PT LONG TERM GOAL #4   Title  Patient will negotiate stairs reciprocally with normal step pattern w/o limitation due to L knee pain or weakness    Status  On-going      PT LONG TERM GOAL #5   Title  Patient to report ability to perform ADLs, household and 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 reporting she is still having L medial knee pain when walking and bearing weight throughout L LE.  MT addressing ongoing TPs in L groin and L lateral quad/ITB musculature.  Pt. notes she has been "rolling" these muscles at home with some relief.  Pt. able to demo L knee AROM flexion in supine of 122 dg today achieving STG #2.  Continued to work on [employer] as pt.  still with slight quad lag with SLR and with medial knee pain with SAQ activity and palpable reduced VMO activation.  Deferred further taping today as pt. noting limited benefit from this.  HEP updated and will plan to monitor response in coming sessions.    Comorbidities  Metatarsalgia of both feet, B pes cavus & Equinus deformity of feet, HTN, asthma    Rehab Potential  Good    PT Treatment/Interventions  ADLs/Self Care Home  Management;Cryotherapy;Electrical Stimulation;Iontophoresis 4mg /ml Dexamethasone;Moist Heat;Gait training;Stair training;Functional mobility training;Therapeutic activities;Therapeutic exercise;Balance training;Neuromuscular re-education;Patient/family education;Manual techniques;Passive range of motion;Dry needling;Taping;Joint Manipulations    PT Next Visit Plan  L knee flexion ROM; LE strengthening    PT Home Exercise Plan  04/11/2019 - patellar mobs, quad & ITB stretches, SLR; 04/25/2019 - bridge/adduction, bridge/abduction into red TB, sidelying clam shell (no resistance), quad set pillow squeeze supine    Consulted and Agree with Plan of Care  Patient       Patient will benefit from skilled therapeutic intervention in order to improve the following deficits and impairments:  Abnormal gait, Decreased activity tolerance, Decreased balance, Decreased endurance, Decreased mobility, Decreased range of motion, Decreased scar mobility, Decreased strength, Difficulty walking, Increased fascial restricitons, Increased muscle spasms, Impaired perceived functional ability, Impaired flexibility, Pain  Visit Diagnosis: Chronic pain of left knee  Stiffness of left knee, not elsewhere classified  Muscle weakness (generalized)  Other abnormalities of gait and mobility  Difficulty in walking, not elsewhere classified     Problem List Patient Active Problem List   Diagnosis Date Noted  . Asthma with acute exacerbation 12/08/2017  . Seasonal allergic rhinitis due to pollen 07/06/2016  . Mild persistent asthma without complication 07/06/2016  . History of hypertension 07/06/2016  . Wheezing 03/19/2015  . Other allergic rhinitis 03/19/2015  . Equinus deformity of foot, acquired 02/18/2015  . Metatarsalgia of both feet 02/14/2014  . Metatarsal deformity 02/14/2014  . Acquired bilateral pes cavus 02/14/2014    14/04/2013, PTA 04/25/19 6:18 PM   [facility]  [facility] [address]  [address] [place], [place], [place] Phone: [phone]   Fax:  [phone]  Name: ANASTASHA ORTEZ MRN: Letta Median Date of Birth: [birth year]

## 2019-04-26 ENCOUNTER — Ambulatory Visit: Payer: Federal, State, Local not specified - PPO

## 2019-04-26 DIAGNOSIS — R262 Difficulty in walking, not elsewhere classified: Secondary | ICD-10-CM

## 2019-04-26 DIAGNOSIS — G8929 Other chronic pain: Secondary | ICD-10-CM

## 2019-04-26 DIAGNOSIS — R2689 Other abnormalities of gait and mobility: Secondary | ICD-10-CM

## 2019-04-26 DIAGNOSIS — M25662 Stiffness of left knee, not elsewhere classified: Secondary | ICD-10-CM

## 2019-04-26 DIAGNOSIS — M25562 Pain in left knee: Secondary | ICD-10-CM | POA: Diagnosis not present

## 2019-04-26 DIAGNOSIS — M6281 Muscle weakness (generalized): Secondary | ICD-10-CM

## 2019-04-26 NOTE — Patient Instructions (Signed)

## 2019-04-26 NOTE — Therapy (Signed)
[facility] [address]  [place], [place], [address] Phone: [phone]   Fax:  [phone]  Physical Therapy Treatment  Patient Details  Name: Connie Zavala MRN: [id number] Date of Birth: [birth year] Referring Provider (PT): Josiah Lobo, MD   Encounter Date: 04/26/2019  PT End of Session - 04/26/19 1318    Visit Number  6    Number of Visits  16    Date for PT Re-Evaluation  06/06/19    Authorization Type  Federal [employer] - VL: 45    Authorization - Number of Visits  50    PT Start Time  1310    PT Stop Time  1415    PT Time Calculation (min)  65 min    Activity Tolerance  Patient tolerated treatment well    Behavior During Therapy  WFL for tasks assessed/performed       Past Medical History:  Diagnosis Date  . Asthma   . Hypertension     Past Surgical History:  Procedure Laterality Date  . RFA procedure      There were no vitals filed for this visit.  Subjective Assessment - 04/26/19 1315    Subjective  Pt. reporting some muscular soreness in L lateral/anterior thigh which she attributes to exercises in session yesterday.    Pertinent History  L ACL repair - 11/07/18; fall with closed L tibial plateau fracture & ACL rupture - 08/28/18    Patient Stated Goals  "to get my knee back to where I can bend it fully and not limp anymore"    Currently in Pain?  Yes    Pain Score  5     Pain Location  Knee    Pain Orientation  Left;Anterior    Pain Descriptors / Indicators  Aching    Pain Type  Chronic pain;Surgical pain    Pain Onset  More than a month ago    Pain Frequency  Intermittent    Multiple Pain Sites  Yes    Pain Score  8    Pain Location  --   anterior lateral L thigh muscle   Pain Orientation  Left;Anterior;Lateral    Pain Descriptors / Indicators  Sore   "muscle soreness"   Pain Type  Acute pain    Aggravating Factors   exercises in session                       [facility]  Adult PT Treatment/Exercise - 04/26/19 0001      Ambulation/Gait   Ambulation/Gait  Yes    Ambulation/Gait Assistance  5: Supervision    Ambulation/Gait Assistance Details  Cues for even weight shift and increased B step-length; mod carryover however pt. still ambulating with antalgic gait pattern reporting she does not feel like she can shift full weight over L LE due to pain     Ambulation Distance (Feet)  200 Feet    Assistive device  None    Gait Pattern  Step-through pattern;Decreased hip/knee flexion - left;Decreased weight shift to left;Decreased stance time - left;Antalgic      Knee/Hip Exercises: Stretches   Passive Hamstring Stretch  Left;2 reps;30 seconds    Passive Hamstring Stretch Limitations  supine with strap     Hip Flexor Stretch  Left;2 reps;30 seconds    Hip Flexor Stretch Limitations  mod thomas position with strap     ITB Stretch  Left;30 seconds;2 reps  ITB Stretch Limitations  supine with strap    Piriformis Stretch  Left;3 reps;30 seconds    Piriformis Stretch Limitations  Figure-4 and KTOS      Knee/Hip Exercises: Aerobic   Nustep  Lvl 4, 6 min (UE/LE)      Knee/Hip Exercises: Supine   Short Arc Quad Sets  Left;15 reps;AROM    Short Arc Quad Sets Limitations  adduction ball squeeze     Bridges with [employer]  Both;15 reps;Strengthening    Straight Leg Raises  Left;15 reps    Straight Leg Raises Limitations  cues for quad contraction before each rep      Knee/Hip Exercises: Sidelying   Hip ABduction  Left   x 12    Hip ABduction Limitations  cues to avoid hip flexion       Vasopneumatic   Number Minutes Vasopneumatic   15 minutes    Vasopnuematic Location   Knee    Vasopneumatic Pressure  Medium    Vasopneumatic Temperature   34dg      Manual Therapy   Manual Therapy  Joint mobilization    Manual therapy comments  supine     Joint Mobilization  L patellar mobs all directions for improved ROM             PT Education - 04/26/19 1407     Education Details  HEP update;  iontophoresis educational handout including precautions, contraindications, proper wear time; pt. declining to use ionto patch today however interested in use next visit    Person(s) Educated  Patient    Methods  Explanation;Demonstration;Verbal cues;Handout    Comprehension  Verbalized understanding;Returned demonstration;Verbal cues required       PT Short Term Goals - 04/26/19 1607      PT SHORT TERM GOAL #1   Title  Patient will be independent with initial HEP    Status  Achieved    Target Date  05/02/19      PT SHORT TERM GOAL #2   Title  Patient will increase L knee flexion to >/= 120 dg  to promote more normal gait and stair mechanics    Status  Achieved    Target Date  05/09/19      PT SHORT TERM GOAL #3   Title  Patient will ambulate with normal gait pattern    Status  On-going    Target Date  06/06/19        PT Long Term Goals - 04/12/19 1557      PT LONG TERM GOAL #1   Title  Patient will be independent with ongoing/advanced HEP    Status  On-going      PT LONG TERM GOAL #2   Title  Patient will increase L knee flexion to >/= 130 dg to allow for normal gait and stair mechanics    Status  On-going      PT LONG TERM GOAL #3   Title  Patient will demonstrate improved L hip and knee strength to >/= 4+/5 for improved stability    Status  On-going      PT LONG TERM GOAL #4   Title  Patient will negotiate stairs reciprocally with normal step pattern w/o limitation due to L knee pain or weakness    Status  On-going      PT LONG TERM GOAL #5   Title  Patient to report ability to perform ADLs, household and 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  STG #1 met.  SLR with visible improvement in quad control however TKE at times still painful  for patient at L medial knee with SAQ and quad setting activities.  Gait training focused on cueing for even weight shift and increased step-length with some carryover however pt. still tending to revert to antalgic gait on L LE with reduce L LE weight shift.  Ended visit with ice/compression to L knee to reduce post-exercise soreness.  Pt. tender over L pes anserine area today and issued educational handout regarding iontophoresis including precautions, contraindications, and proper wear time.  Pt. verbalized understanding and reports she would be interested in trying iontophoresis for L medial knee pain at upcoming visit.  MD signed order for iontophoresis and will plan to initiate per pt. and per medial knee pain in coming session.    Comorbidities  Metatarsalgia of both feet, B pes cavus & Equinus deformity of feet, HTN, asthma    Rehab Potential  Good    PT Treatment/Interventions  ADLs/Self Care Home Management;Cryotherapy;Electrical Stimulation;Iontophoresis 22m/ml Dexamethasone;Moist Heat;Gait training;Stair training;Functional mobility training;Therapeutic activities;Therapeutic exercise;Balance training;Neuromuscular re-education;Patient/family education;Manual techniques;Passive range of motion;Dry needling;Taping;Joint Manipulations    PT Next Visit Plan  L knee flexion ROM; LE strengthening    PT Home Exercise Plan  04/11/2019 - patellar mobs, quad & ITB stretches, SLR; 04/25/2019 - bridge/adduction, bridge/abduction into red TB, sidelying clam shell (no resistance), quad set pillow squeeze supine    Consulted and Agree with Plan of Care  Patient       Patient will benefit from skilled therapeutic intervention in order to improve the following deficits and impairments:  Abnormal gait, Decreased activity tolerance, Decreased balance, Decreased endurance, Decreased mobility, Decreased range of motion, Decreased scar mobility, Decreased strength, Difficulty walking, Increased fascial restricitons,  Increased muscle spasms, Impaired perceived functional ability, Impaired flexibility, Pain  Visit Diagnosis: Chronic pain of left knee  Stiffness of left knee, not elsewhere classified  Muscle weakness (generalized)  Other abnormalities of gait and mobility  Difficulty in walking, not elsewhere classified     Problem List Patient Active Problem List   Diagnosis Date Noted  . Asthma with acute exacerbation 12/08/2017  . Seasonal allergic rhinitis due to pollen 07/06/2016  . Mild persistent asthma without complication 029/56/2130 . History of hypertension 07/06/2016  . Wheezing 03/19/2015  . Other allergic rhinitis 03/19/2015  . Equinus deformity of foot, acquired 02/18/2015  . Metatarsalgia of both feet 02/14/2014  . Metatarsal deformity 02/14/2014  . Acquired bilateral pes cavus 02/14/2014    MBess Harvest PTA 04/26/19 4:14 PM   [facility] [address] [place] [address]: [phone]  Fax:  [phone] Name: Connie DELAFUENTEMRN: [id number] of Birth: [birth year]

## 2019-05-02 ENCOUNTER — Other Ambulatory Visit: Payer: Self-pay

## 2019-05-02 ENCOUNTER — Ambulatory Visit: Payer: Federal, State, Local not specified - PPO

## 2019-05-02 DIAGNOSIS — G8929 Other chronic pain: Secondary | ICD-10-CM

## 2019-05-02 DIAGNOSIS — R262 Difficulty in walking, not elsewhere classified: Secondary | ICD-10-CM

## 2019-05-02 DIAGNOSIS — M25562 Pain in left knee: Secondary | ICD-10-CM

## 2019-05-02 DIAGNOSIS — M25662 Stiffness of left knee, not elsewhere classified: Secondary | ICD-10-CM

## 2019-05-02 DIAGNOSIS — R2689 Other abnormalities of gait and mobility: Secondary | ICD-10-CM

## 2019-05-02 DIAGNOSIS — M6281 Muscle weakness (generalized): Secondary | ICD-10-CM

## 2019-05-02 NOTE — Therapy (Addendum)
[facility] [address]  [place], [place], [address] Phone: [phone]   Fax:  [phone]  Physical Therapy Treatment  Patient Details  Name: Connie Zavala MRN: [id number] Date of Birth: 1965-05-28 Referring Provider (PT): Josiah Lobo, MD   Encounter Date: 05/02/2019  PT End of Session - 05/02/19 1322    Visit Number  7    Number of Visits  16    Date for PT Re-Evaluation  06/06/19    Authorization Type  Federal [employer] - VL: 50    Authorization - Number of Visits  50    PT Start Time  1311    PT Stop Time  1408    PT Time Calculation (min)  57 min    Activity Tolerance  Patient tolerated treatment well    Behavior During Therapy  WFL for tasks assessed/performed       Past Medical History:  Diagnosis Date  . Asthma   . Hypertension     Past Surgical History:  Procedure Laterality Date  . RFA procedure      There were no vitals filed for this visit.  Subjective Assessment - 05/02/19 1315    Subjective  Pt. reporting average L knee pain 8-9/10 while at work.    Pertinent History  L ACL repair - 11/07/18; fall with closed L tibial plateau fracture & ACL rupture - 08/28/18    Patient Stated Goals  "to get my knee back to where I can bend it fully and not limp anymore"    Currently in Pain?  Yes    Pain Score  3    up to 8-9/10 at times at work   Pain Location  Knee    Pain Orientation  Left;Anterior    Pain Descriptors / Indicators  Aching    Pain Type  Chronic pain;Surgical pain    Pain Onset  More than a month ago    Pain Frequency  Intermittent                       [facility] Adult PT Treatment/Exercise - 05/02/19 0001      Knee/Hip Exercises: Stretches   Passive Hamstring Stretch  Left;2 reps;30 seconds    Passive Hamstring Stretch Limitations  supine with strap     Quad Stretch  Left;2 reps;30 seconds    Quad Stretch Limitations  prone with strap    Hip Flexor Stretch  Left;2  reps;30 seconds    Hip Flexor Stretch Limitations  mod thomas position with strap     ITB Stretch  Left;30 seconds;2 reps    ITB Stretch Limitations  supine with strap      Knee/Hip Exercises: Aerobic   Recumbent Bike  L1 x 6 min      Knee/Hip Exercises: Standing   Forward Step Up  Left;10 reps;Hand Hold: 2;Step Height: 4"    Forward Step Up Limitations  + green TB TKE with therapist     Functional Squat  15 reps;3 seconds    Functional Squat Limitations  at TM tripple extension    cues for quad set     Knee/Hip Exercises: Supine   Quad Sets  Left;10 reps    Quad Sets Limitations  10" hold pillow squeeze     Bridges with [employer]  Both;10 reps;Strengthening   + isometric hip abd/ER into yellow TB    Straight Leg Raises  Left;10 reps;Strengthening    Straight  Leg Raises Limitations  1#; cues for quad contraction before each rep    Other Supine Knee/Hip Exercises  Hooklying L hip ER with yellow TB x 10 reps       Knee/Hip Exercises: Sidelying   Clams  R sidelying L clam shell x 10 reps       Vasopneumatic   Number Minutes Vasopneumatic   15 minutes    Vasopnuematic Location   Knee    Vasopneumatic Pressure  Medium    Vasopneumatic Temperature   34dg               PT Short Term Goals - 04/26/19 1607      PT SHORT TERM GOAL #1   Title  Patient will be independent with initial HEP    Status  Achieved    Target Date  05/02/19      PT SHORT TERM GOAL #2   Title  Patient will increase L knee flexion to >/= 120 dg  to promote more normal gait and stair mechanics    Status  Achieved    Target Date  05/09/19      PT SHORT TERM GOAL #3   Title  Patient will ambulate with normal gait pattern    Status  On-going    Target Date  06/06/19        PT Long Term Goals - 04/12/19 1557      PT LONG TERM GOAL #1   Title  Patient will be independent with ongoing/advanced HEP    Status  On-going      PT LONG TERM GOAL #2   Title  Patient will increase L knee flexion to  >/= 130 dg to allow for normal gait and stair mechanics    Status  On-going      PT LONG TERM GOAL #3   Title  Patient will demonstrate improved L hip and knee strength to >/= 4+/5 for improved stability    Status  On-going      PT LONG TERM GOAL #4   Title  Patient will negotiate stairs reciprocally with normal step pattern w/o limitation due to L knee pain or weakness    Status  On-going      PT LONG TERM GOAL #5   Title  Patient to report ability to perform ADLs, household and 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  Notes pain today is a "medium day".  Pain still local to medial knee which is intermittent during supine and standing therex.  Focused session on quad/VMO strengthening and progressed to mini squat (triple extension) and 4" forward step-up + green TB TKE.  Pt. seems to be ambulating with more even weight shift with her gait pattern now and reports she is trying to adjust her walking pattern at work.  Ended visit with ice/compression to L knee to reduce post-exercise swelling.    Comorbidities  Metatarsalgia of both feet, B pes cavus & Equinus deformity of feet, HTN, asthma    Rehab Potential  Good    PT Treatment/Interventions  ADLs/Self Care Home Management;Cryotherapy;Electrical Stimulation;Iontophoresis 4mg /ml Dexamethasone;Moist Heat;Gait training;Stair training;Functional mobility training;Therapeutic activities;Therapeutic exercise;Balance training;Neuromuscular re-education;Patient/family education;Manual techniques;Passive range of motion;Dry needling;Taping;Joint Manipulations    PT Next Visit Plan  L knee flexion ROM; LE strengthening    PT Home Exercise Plan  04/11/2019 - patellar mobs, quad & ITB stretches, SLR; 04/25/2019 - bridge/adduction, bridge/abduction into  red TB, sidelying clam shell (no resistance), quad set pillow squeeze supine    Consulted and Agree with Plan of Care  Patient       Patient will benefit from skilled therapeutic intervention in order to improve the following deficits and impairments:  Abnormal gait, Decreased activity tolerance, Decreased balance, Decreased endurance, Decreased mobility, Decreased range of motion, Decreased scar mobility, Decreased strength, Difficulty walking, Increased fascial restricitons, Increased muscle spasms, Impaired perceived functional ability, Impaired flexibility, Pain  Visit Diagnosis: Chronic pain of left knee  Stiffness of left knee, not elsewhere classified  Muscle weakness (generalized)  Other abnormalities of gait and mobility  Difficulty in walking, not elsewhere classified     Problem List Patient Active Problem List   Diagnosis Date Noted  . Asthma with acute exacerbation 12/08/2017  . Seasonal allergic rhinitis due to pollen 07/06/2016  . Mild persistent asthma without complication 07/06/2016  . History of hypertension 07/06/2016  . Wheezing 03/19/2015  . Other allergic rhinitis 03/19/2015  . Equinus deformity of foot, acquired 02/18/2015  . Metatarsalgia of both feet 02/14/2014  . Metatarsal deformity 02/14/2014  . Acquired bilateral pes cavus 02/14/2014    Kermit Balo, PTA 05/02/19 5:34 PM   [facility] [address]  [address] [place], [place], [address] Phone: [phone]   Fax:  [phone]  Name: Connie Zavala MRN: [id number] Date of Birth: 1965-08-04

## 2019-05-05 ENCOUNTER — Ambulatory Visit: Payer: Federal, State, Local not specified - PPO | Admitting: Physical Therapy

## 2019-05-09 ENCOUNTER — Encounter: Payer: Self-pay | Admitting: Physical Therapy

## 2019-05-09 ENCOUNTER — Ambulatory Visit: Payer: Federal, State, Local not specified - PPO | Admitting: Physical Therapy

## 2019-05-09 ENCOUNTER — Other Ambulatory Visit: Payer: Self-pay

## 2019-05-09 DIAGNOSIS — G8929 Other chronic pain: Secondary | ICD-10-CM

## 2019-05-09 DIAGNOSIS — R2689 Other abnormalities of gait and mobility: Secondary | ICD-10-CM

## 2019-05-09 DIAGNOSIS — M25662 Stiffness of left knee, not elsewhere classified: Secondary | ICD-10-CM

## 2019-05-09 DIAGNOSIS — M6281 Muscle weakness (generalized): Secondary | ICD-10-CM

## 2019-05-09 DIAGNOSIS — M25562 Pain in left knee: Secondary | ICD-10-CM

## 2019-05-09 DIAGNOSIS — R262 Difficulty in walking, not elsewhere classified: Secondary | ICD-10-CM

## 2019-05-09 NOTE — Therapy (Signed)
[facility] Outpatient Rehabilitation [facility] [address]  [address] [place], [place], [address] Phone: [phone]   Fax:  [phone]  Physical Therapy Treatment  Patient Details  Name: Connie Zavala MRN: [id number] Date of Birth: 1965-09-22 Referring Provider (PT): Lenice Pressman, MD   Encounter Date: 05/09/2019  PT End of Session - 05/09/19 1316    Visit Number  8    Number of Visits  16    Date for PT Re-Evaluation  06/06/19    Authorization Type  Federal [employer] - VL: 50    Authorization - Number of Visits  50    PT Start Time  1316    PT Stop Time  1359    PT Time Calculation (min)  43 min    Activity Tolerance  Patient tolerated treatment well    Behavior During Therapy  [facility] for tasks assessed/performed       Past Medical History:  Diagnosis Date  . Asthma   . Hypertension     Past Surgical History:  Procedure Laterality Date  . RFA procedure      There were no vitals filed for this visit.  Subjective Assessment - 05/09/19 1318    Subjective  Pt reporting L knee pain still get pretty bad at times - up to 10/10 - requiring her to take a pain pill. Also notes continued intermittent swelling.    Pertinent History  L ACL repair - 11/07/18; fall with closed L tibial plateau fracture & ACL rupture - 08/28/18    Patient Stated Goals  "to get my knee back to where I can bend it fully and not limp anymore"    Currently in Pain?  Yes    Pain Score  0-No pain   pain can get up to a 10/10 at times   Pain Location  Knee    Pain Orientation  Left    Pain Type  Chronic pain;Surgical pain    Pain Frequency  Intermittent         [facility] PT Assessment - 05/09/19 1316      Assessment   Medical Diagnosis  L knee pain s/p ACL reconstruction    Referring Provider (PT)  Lenice Pressman, MD    Onset Date/Surgical Date  11/07/18   initial injury 08/28/18   Next MD Visit  06/19/19                   [facility] Adult PT Treatment/Exercise - 05/09/19  1316      Exercises   Exercises  Knee/Hip      Knee/Hip Exercises: Stretches   Other Knee/Hip Stretches  Seated L hip adductor stretches - lateral lunge with foot on 9" stool & ring sitting 2 x 30 sec each      Knee/Hip Exercises: Aerobic   Recumbent Bike  L2 x 6 min      Knee/Hip Exercises: Standing   Hip Flexion  Right;Left;10 reps;Stengthening;Knee straight    Hip Flexion Limitations  yellow TB, 2 pole A for balance; cues to avoid knee hyperextension on stance leg    Hip ADduction  Right;Left;10 reps;Strengthening    Hip ADduction Limitations  yellow TB, 2 pole A for balance; cues to avoid knee hyperextension on stance leg    Hip Abduction  Right;Left;10 reps;Stengthening;Knee straight    Abduction Limitations  yellow TB, 2 pole A for balance; cues to avoid knee hyperextension on stance leg    Hip Extension  Right;Left;10 reps;Stengthening;Knee  straight    Extension Limitations  yellow TB, 2 pole A for balance; cues to avoid knee hyperextension on stance leg      Knee/Hip Exercises: Seated   Knee/Hip Flexion  Hip adduction ball squeeze + B hip IR with yellow TB x 10    Hamstring Curl  Left;10 reps;Strengthening    Hamstring Limitations  looped yellow TB at ankles with R foot propped on 9" stool      Manual Therapy   Manual Therapy  Soft tissue mobilization;Other (comment)    Soft tissue mobilization  L gracilis, lateral quads, HS and ITB    Other Manual Therapy  Reviewed instruction in use of rolling pin for self-STM to L hip adductors, quads, HS & ITB               PT Short Term Goals - 05/09/19 1323      PT SHORT TERM GOAL #1   Title  Patient will be independent with initial HEP    Status  Achieved   04/25/19     PT SHORT TERM GOAL #2   Title  Patient will increase L knee flexion to >/= 120 dg  to promote more normal gait and stair mechanics    Status  Achieved   04/26/19     PT SHORT TERM GOAL #3   Title  Patient will ambulate with normal gait pattern     Status  On-going   05/09/19 - pt still favoring L LE with decreased knee extension during gait   Target Date  05/09/19        PT Long Term Goals - 05/09/19 1324      PT LONG TERM GOAL #1   Title  Patient will be independent with ongoing/advanced HEP    Status  On-going    Target Date  06/06/19      PT LONG TERM GOAL #2   Title  Patient will increase L knee flexion to >/= 130 dg to allow for normal gait and stair mechanics    Status  On-going    Target Date  06/06/19      PT LONG TERM GOAL #3   Title  Patient will demonstrate improved L hip and knee strength to >/= 4+/5 for improved stability    Status  On-going    Target Date  06/06/19      PT LONG TERM GOAL #4   Title  Patient will negotiate stairs reciprocally with normal step pattern w/o limitation due to L knee pain or weakness    Status  On-going    Target Date  06/06/19      PT LONG TERM GOAL #5   Title  Patient to report ability to perform ADLs, household and 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 Reviewed self-STM and provided instruction in stretching for hip adductors to decrease tension on pes anserine and medial knee. Strengthening targeting proximal LE strengthening with increased emphasis on hip adductors to promote medial knee stability. Cues necessary to avoid locking knees into hyperextension during standing exercises. Patient declining need for modalities at end of session.    Comorbidities  Metatarsalgia of both feet, B pes cavus &  Equinus deformity of feet, HTN, asthma    Rehab Potential  Good    PT Frequency  2x / week    PT Duration  8 weeks    PT Treatment/Interventions  ADLs/Self Care Home  Management;Cryotherapy;Electrical Stimulation;Iontophoresis 4mg /ml Dexamethasone;Moist Heat;Gait training;Stair training;Functional mobility training;Therapeutic activities;Therapeutic exercise;Balance training;Neuromuscular re-education;Patient/family education;Manual techniques;Passive range of motion;Dry needling;Taping;Joint Manipulations    PT Next Visit Plan  L knee flexion ROM; LE strengthening    PT Home Exercise Plan  04/11/19 - patellar mobs, quad & ITB stretches, SLR; 04/25/19 - bridge/adduction, bridge/abduction into red TB, sidelying clam shell (no resistance), quad set pillow squeeze supine    Consulted and Agree with Plan of Care  Patient       Patient will benefit from skilled therapeutic intervention in order to improve the following deficits and impairments:  Abnormal gait, Decreased activity tolerance, Decreased balance, Decreased endurance, Decreased mobility, Decreased range of motion, Decreased scar mobility, Decreased strength, Difficulty walking, Increased fascial restricitons, Increased muscle spasms, Impaired perceived functional ability, Impaired flexibility, Pain  Visit Diagnosis: Chronic pain of left knee  Stiffness of left knee, not elsewhere classified  Muscle weakness (generalized)  Other abnormalities of gait and mobility  Difficulty in walking, not elsewhere classified     Problem List Patient Active Problem List   Diagnosis Date Noted  . Asthma with acute exacerbation 12/08/2017  . Seasonal allergic rhinitis due to pollen 07/06/2016  . Mild persistent asthma without complication 49/70/2637  . History of hypertension 07/06/2016  . Wheezing 03/19/2015  . Other allergic rhinitis 03/19/2015  . Equinus deformity of foot, acquired 02/18/2015  . Metatarsalgia of both feet 02/14/2014  . Metatarsal deformity 02/14/2014  . Acquired bilateral pes cavus 02/14/2014    Percival Spanish, PT, MPT 05/09/2019, 3:58 PM  [facility] [address]  [place], [place], [address] Phone: [phone]   Fax:  [phone]  Name: Connie Zavala MRN: [id number] Date of Birth: [birth year]

## 2019-05-10 ENCOUNTER — Ambulatory Visit: Payer: Federal, State, Local not specified - PPO

## 2019-05-10 ENCOUNTER — Other Ambulatory Visit: Payer: Self-pay

## 2019-05-10 DIAGNOSIS — R2689 Other abnormalities of gait and mobility: Secondary | ICD-10-CM

## 2019-05-10 DIAGNOSIS — M6281 Muscle weakness (generalized): Secondary | ICD-10-CM

## 2019-05-10 DIAGNOSIS — M25562 Pain in left knee: Secondary | ICD-10-CM

## 2019-05-10 DIAGNOSIS — M25662 Stiffness of left knee, not elsewhere classified: Secondary | ICD-10-CM

## 2019-05-10 DIAGNOSIS — R262 Difficulty in walking, not elsewhere classified: Secondary | ICD-10-CM

## 2019-05-10 DIAGNOSIS — G8929 Other chronic pain: Secondary | ICD-10-CM

## 2019-05-10 NOTE — Therapy (Signed)
[facility] Outpatient Rehabilitation [facility] [address]  [address] [place], [place], [address] Phone: [phone]   Fax:  [phone]  Physical Therapy Treatment  Patient Details  Name: Connie Zavala MRN: [id number] Date of Birth: 1965/07/12 Referring Provider (PT): Lenice Pressman, MD   Encounter Date: 05/10/2019  PT End of Session - 05/10/19 1324    Visit Number  9    Number of Visits  16    Date for PT Re-Evaluation  06/06/19    Authorization Type  Federal [employer] - VL: 50    Authorization - Number of Visits  50    PT Start Time  1319    PT Stop Time  1415    PT Time Calculation (min)  56 min    Activity Tolerance  Patient tolerated treatment well    Behavior During Therapy  WFL for tasks assessed/performed       Past Medical History:  Diagnosis Date  . Asthma   . Hypertension     Past Surgical History:  Procedure Laterality Date  . RFA procedure      There were no vitals filed for this visit.  Subjective Assessment - 05/10/19 1322    Subjective  Pt. reporting mild muscle soreness after yesterday's session.    Pertinent History  L ACL repair - 11/07/18; fall with closed L tibial plateau fracture & ACL rupture - 08/28/18    Patient Stated Goals  "to get my knee back to where I can bend it fully and not limp anymore"    Currently in Pain?  No/denies    Pain Score  3     Pain Location  Knee    Pain Orientation  Left    Pain Descriptors / Indicators  Aching    Pain Type  Surgical pain    Pain Radiating Towards  muscle soreness into L inner thigh muscles    Pain Onset  More than a month ago    Pain Frequency  Intermittent                       [facility] Adult PT Treatment/Exercise - 05/10/19 0001      Knee/Hip Exercises: Aerobic   Recumbent Bike  L2 x 6 min      Knee/Hip Exercises: Standing   Terminal Knee Extension  Left;15 reps;Theraband    Theraband Level (Terminal Knee Extension)  Level 4 (Blue)    Terminal Knee Extension  Limitations  blue TB TKE in door     Functional Squat  15 reps;3 seconds    Functional Squat Limitations  at TM tripple extension     Wall Squat  5 reps;1 set;3 seconds   terminated due to onset of medial knee pain    Wall Squat Limitations  + adduction ball squeeze       Knee/Hip Exercises: Supine   Short Arc Quad Sets  Left;10 reps    Short Arc Quad Sets Limitations  1#; 8" hold     Bridges with [employer]  Both;15 reps;Strengthening   adduction ball squeeze    Straight Leg Raise with External Rotation  Left;15 reps    Straight Leg Raise with External Rotation Limitations  cues for TKE      Knee/Hip Exercises: Sidelying   Hip ADduction  Left;15 reps;Strengthening      Vasopneumatic   Number Minutes Vasopneumatic   15 minutes    Vasopnuematic Location   Knee  Vasopneumatic Pressure  Medium    Vasopneumatic Temperature   34dg             PT Education - 05/10/19 1654    Education Details  HEP update;  TKE with blue TB issued to pt.    Person(s) Educated  Patient    Methods  Explanation;Demonstration;Verbal cues;Handout    Comprehension  Verbalized understanding;Returned demonstration;Verbal cues required       PT Short Term Goals - 05/09/19 1323      PT SHORT TERM GOAL #1   Title  Patient will be independent with initial HEP    Status  Achieved   04/25/19     PT SHORT TERM GOAL #2   Title  Patient will increase L knee flexion to >/= 120 dg  to promote more normal gait and stair mechanics    Status  Achieved   04/26/19     PT SHORT TERM GOAL #3   Title  Patient will ambulate with normal gait pattern    Status  On-going   05/09/19 - pt still favoring L LE with decreased knee extension during gait   Target Date  05/09/19        PT Long Term Goals - 05/09/19 1324      PT LONG TERM GOAL #1   Title  Patient will be independent with ongoing/advanced HEP    Status  On-going    Target Date  06/06/19      PT LONG TERM GOAL #2   Title  Patient will increase L  knee flexion to >/= 130 dg to allow for normal gait and stair mechanics    Status  On-going    Target Date  06/06/19      PT LONG TERM GOAL #3   Title  Patient will demonstrate improved L hip and knee strength to >/= 4+/5 for improved stability    Status  On-going    Target Date  06/06/19      PT LONG TERM GOAL #4   Title  Patient will negotiate stairs reciprocally with normal step pattern w/o limitation due to L knee pain or weakness    Status  On-going    Target Date  06/06/19      PT LONG TERM GOAL #5   Title  Patient to report ability to perform ADLs, household and 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  Tolerated focus of today's session on L quad/TKE strengthening along with L groin strengthening with intermittent complaint of L knee pain which she states is "typical" and quickly subsided with rest.  Had to terminated shallow wall slide/hip adduction squeeze due to L knee pain.  Better tolerance for shallow "mini" squat triple extension at sink.  Was demonstrating improved awareness of L TKE with gait today.  Ended session with ice/compression to L knee to reduce post-exercise soreness and pain.    Comorbidities  Metatarsalgia of both feet, B pes cavus & Equinus deformity of feet, HTN, asthma    Rehab Potential  Good    PT Treatment/Interventions  ADLs/Self Care Home Management;Cryotherapy;Electrical Stimulation;Iontophoresis 4mg /ml Dexamethasone;Moist Heat;Gait training;Stair training;Functional mobility training;Therapeutic activities;Therapeutic exercise;Balance training;Neuromuscular re-education;Patient/family education;Manual techniques;Passive range of motion;Dry needling;Taping;Joint Manipulations    PT Next Visit  Plan  L knee flexion ROM; LE strengthening     PT Home Exercise Plan  04/11/19 - patellar mobs, quad & ITB stretches, SLR; 04/25/19 - bridge/adduction, bridge/abduction into red TB, sidelying clam shell (no resistance), quad set pillow squeeze supine;  05/10/19 - TKE standing with blue band    Consulted and Agree with Plan of Care  Patient       Patient will benefit from skilled therapeutic intervention in order to improve the following deficits and impairments:  Abnormal gait, Decreased activity tolerance, Decreased balance, Decreased endurance, Decreased mobility, Decreased range of motion, Decreased scar mobility, Decreased strength, Difficulty walking, Increased fascial restricitons, Increased muscle spasms, Impaired perceived functional ability, Impaired flexibility, Pain  Visit Diagnosis: Chronic pain of left knee  Stiffness of left knee, not elsewhere classified  Muscle weakness (generalized)  Other abnormalities of gait and mobility  Difficulty in walking, not elsewhere classified     Problem List Patient Active Problem List   Diagnosis Date Noted  . Asthma with acute exacerbation 12/08/2017  . Seasonal allergic rhinitis due to pollen 07/06/2016  . Mild persistent asthma without complication 45/36/4680  . History of hypertension 07/06/2016  . Wheezing 03/19/2015  . Other allergic rhinitis 03/19/2015  . Equinus deformity of foot, acquired 02/18/2015  . Metatarsalgia of both feet 02/14/2014  . Metatarsal deformity 02/14/2014  . Acquired bilateral pes cavus 02/14/2014    Bess Harvest, PTA 05/10/19 4:59 PM   [facility] [address]  [place], [place], [address] Phone: [phone]   Fax:  [phone]  Name: Connie Zavala MRN: [id number] Date of Birth: 02-08-1966

## 2019-05-16 ENCOUNTER — Other Ambulatory Visit: Payer: Self-pay

## 2019-05-16 ENCOUNTER — Ambulatory Visit: Payer: Federal, State, Local not specified - PPO | Attending: Orthopedic Surgery

## 2019-05-16 DIAGNOSIS — M25562 Pain in left knee: Secondary | ICD-10-CM | POA: Insufficient documentation

## 2019-05-16 DIAGNOSIS — G8929 Other chronic pain: Secondary | ICD-10-CM | POA: Insufficient documentation

## 2019-05-16 DIAGNOSIS — R2689 Other abnormalities of gait and mobility: Secondary | ICD-10-CM | POA: Insufficient documentation

## 2019-05-16 DIAGNOSIS — R262 Difficulty in walking, not elsewhere classified: Secondary | ICD-10-CM | POA: Diagnosis present

## 2019-05-16 DIAGNOSIS — M6281 Muscle weakness (generalized): Secondary | ICD-10-CM | POA: Insufficient documentation

## 2019-05-16 DIAGNOSIS — M25662 Stiffness of left knee, not elsewhere classified: Secondary | ICD-10-CM | POA: Insufficient documentation

## 2019-05-17 ENCOUNTER — Ambulatory Visit: Payer: Federal, State, Local not specified - PPO

## 2019-05-17 DIAGNOSIS — M25562 Pain in left knee: Secondary | ICD-10-CM | POA: Diagnosis not present

## 2019-05-17 DIAGNOSIS — R262 Difficulty in walking, not elsewhere classified: Secondary | ICD-10-CM

## 2019-05-17 DIAGNOSIS — M25662 Stiffness of left knee, not elsewhere classified: Secondary | ICD-10-CM

## 2019-05-17 DIAGNOSIS — G8929 Other chronic pain: Secondary | ICD-10-CM

## 2019-05-17 DIAGNOSIS — R2689 Other abnormalities of gait and mobility: Secondary | ICD-10-CM

## 2019-05-17 DIAGNOSIS — M6281 Muscle weakness (generalized): Secondary | ICD-10-CM

## 2019-05-17 NOTE — Therapy (Addendum)
[facility] [address]  [place], [place], [address] Phone: [phone]   Fax:  [phone]  Physical Therapy Treatment  Patient Details  Name: Connie Zavala MRN: [id number] Date of Birth: 1965/04/29 Referring Provider (PT): Josiah Lobo, MD   Encounter Date: 05/17/2019  PT End of Session - 05/17/19 1323    Visit Number  11    Number of Visits  16    Date for PT Re-Evaluation  06/06/19    Authorization Type  Federal [employer] - VL: 31    Authorization - Number of Visits  50    PT Start Time  1316    PT Stop Time  1400    PT Time Calculation (min)  44 min    Activity Tolerance  Patient tolerated treatment well    Behavior During Therapy  WFL for tasks assessed/performed       Past Medical History:  Diagnosis Date  . Asthma   . Hypertension     Past Surgical History:  Procedure Laterality Date  . RFA procedure      There were no vitals filed for this visit.  Subjective Assessment - 05/17/19 1321    Subjective  Pt. denies soreness after yesterday's session.  Does wish to try iontophoresis today for medial knee pain.    Pertinent History  L ACL repair - 11/07/18; fall with closed L tibial plateau fracture & ACL rupture - 08/28/18    Patient Stated Goals  "to get my knee back to where I can bend it fully and not limp anymore"    Currently in Pain?  No/denies    Pain Score  0-No pain    Pain Location  Knee    Pain Orientation  Left    Multiple Pain Sites  No                       [facility] Adult PT Treatment/Exercise - 05/17/19 0001      Knee/Hip Exercises: Stretches   ITB Stretch  Left;30 seconds;2 reps    ITB Stretch Limitations  supine with strap      Knee/Hip Exercises: Aerobic   Recumbent Bike  L2 x 6 min      Knee/Hip Exercises: Standing   Hip Flexion  Right;Left;10 reps;Stengthening;Knee straight   cues for TKE and abdom. bracing    Hip Flexion Limitations  yellow TB, 2 pole A for  balance     Hip ADduction  Right;Left;10 reps;Strengthening   cues for TKE and abdom. bracing    Hip ADduction Limitations  yellow TB, 2 pole A for balanc     Hip Abduction  Right;Left;10 reps;Stengthening;Knee straight   cues for TKE and abdom. bracing    Abduction Limitations  yellow TB, 2 pole A for balance     Hip Extension  Right;Left;10 reps;Stengthening;Knee straight   cues for TKE and abdom. bracing    Extension Limitations  yellow TB, 2 pole A for balance       Knee/Hip Exercises: Sidelying   Clams  L clam shell yellow TB 3" x 12 rpes       Iontophoresis   Type of Iontophoresis  Dexamethasone    Location  L medial knee in Pes Anserine bursa area     Dose  45m-min, 1.050m   Time  4-6 hours wear time    1#/6            PT  Education - 05/17/19 1401    Education Details  HEP update; 4-way hip kicker with yellow TB issued to pt.;  Pt. previously issued ionto education handout including contraindications, precautions, wear-time    Person(s) Educated  Patient    Methods  Explanation;Demonstration;Verbal cues;Handout    Comprehension  Verbalized understanding;Returned demonstration;Verbal cues required       PT Short Term Goals - 05/16/19 1339      PT SHORT TERM GOAL #1   Title  Patient will be independent with initial HEP    Status  Achieved   04/25/19     PT SHORT TERM GOAL #2   Title  Patient will increase L knee flexion to >/= 120 dg  to promote more normal gait and stair mechanics    Status  Achieved   04/26/19     PT SHORT TERM GOAL #3   Title  Patient will ambulate with normal gait pattern    Status  Partially Met   05/16/19 - Pt. ambulating with improved L TKE and weight shift; still limited with L weight shift and L TKE   Target Date  05/09/19        PT Long Term Goals - 05/16/19 1341      PT LONG TERM GOAL #1   Title  Patient will be independent with ongoing/advanced HEP    Status  Partially Met      PT LONG TERM GOAL #2   Title  Patient will  increase L knee flexion to >/= 130 dg to allow for normal gait and stair mechanics    Status  On-going   05/16/19: L knee AROM -2-118 dg     PT LONG TERM GOAL #3   Title  Patient will demonstrate improved L hip and knee strength to >/= 4+/5 for improved stability    Status  Partially Met   05/17/19: met for L knee extenion and L hip flexion     PT LONG TERM GOAL #4   Title  Patient will negotiate stairs reciprocally with normal step pattern w/o limitation due to L knee pain or weakness    Status  On-going   05/16/19:  ambulating with step-to pattern and patient notes this is due to L medial knees pain     PT LONG TERM GOAL #5   Title  Patient to report ability to perform ADLs, household and 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  Continued 4-way hip kicker to address ongoing L hip weakness with pt. tolerating well and good overall pacing and technique thus issued this activity for HEP update.  Pt. requesting to try iontophoresis and previously issue educational handout including precautions, contraindications, and proper wear-time.  Applied ionto patch #1/6 to L medial knee in area of tenderness over Pes Anserine area.  Will monitor response to patch at upcoming session.    Comorbidities  Metatarsalgia of both feet, B pes cavus & Equinus deformity of feet, HTN, asthma    Rehab Potential  Good    PT Treatment/Interventions  ADLs/Self Care Home Management;Cryotherapy;Electrical Stimulation;Iontophoresis 74m/ml Dexamethasone;Moist Heat;Gait training;Stair training;Functional mobility training;Therapeutic activities;Therapeutic exercise;Balance training;Neuromuscular re-education;Patient/family education;Manual techniques;Passive range of motion;Dry  needling;Taping;Joint Manipulations    PT Next Visit Plan  Monitor response to ionto patch #1/6; L knee flexion ROM; LE strengthening    PT Home Exercise Plan  04/11/19 - patellar mobs, quad & ITB stretches, SLR; 04/25/19 - bridge/adduction, bridge/abduction into red TB, sidelying clam shell (no resistance), quad set pillow squeeze supine;  05/10/19 - TKE standing with blue band;  05/17/19 - 4-way hip kicker with yellow TB    Consulted and Agree with Plan of Care  Patient       Patient will benefit from skilled therapeutic intervention in order to improve the following deficits and impairments:  Abnormal gait, Decreased activity tolerance, Decreased balance, Decreased endurance, Decreased mobility, Decreased range of motion, Decreased scar mobility, Decreased strength, Difficulty walking, Increased fascial restricitons, Increased muscle spasms, Impaired perceived functional ability, Impaired flexibility, Pain  Visit Diagnosis: Chronic pain of left knee  Stiffness of left knee, not elsewhere classified  Muscle weakness (generalized)  Other abnormalities of gait and mobility  Difficulty in walking, not elsewhere classified     Problem List Patient Active Problem List   Diagnosis Date Noted  . Asthma with acute exacerbation 12/08/2017  . Seasonal allergic rhinitis due to pollen 07/06/2016  . Mild persistent asthma without complication 52/77/8242  . History of hypertension 07/06/2016  . Wheezing 03/19/2015  . Other allergic rhinitis 03/19/2015  . Equinus deformity of foot, acquired 02/18/2015  . Metatarsalgia of both feet 02/14/2014  . Metatarsal deformity 02/14/2014  . Acquired bilateral pes cavus 02/14/2014    Bess Harvest, PTA 05/17/19 5:47 PM   [facility] [address]  [place], [place], [address] Phone: [phone]   Fax:  [phone]  Name: Connie Zavala MRN: [id number] Date of Birth: 08/15/1965

## 2019-05-17 NOTE — Therapy (Signed)
[facility] [address]  [place], [place], [address] Phone: [phone]   Fax:  [phone]  Physical Therapy Treatment  Patient Details  Name: Connie Zavala MRN: [id number] Date of Birth: September 15, 1965 Referring Provider (PT): Josiah Lobo, MD   Encounter Date: 05/16/2019  PT End of Session - 05/16/19 1319    Visit Number  10    Number of Visits  16    Date for PT Re-Evaluation  06/06/19    Authorization Type  Federal [employer] - VL: 75    Authorization - Number of Visits  50    PT Start Time  4193    PT Stop Time  1410    PT Time Calculation (min)  55 min    Activity Tolerance  Patient tolerated treatment well    Behavior During Therapy  WFL for tasks assessed/performed       Past Medical History:  Diagnosis Date  . Asthma   . Hypertension     Past Surgical History:  Procedure Laterality Date  . RFA procedure      There were no vitals filed for this visit.     Subjective: Pt. 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  Notes ~ 50% improvement in L knee since starting therapy.    L medial knee pain reported 2/10 today    Therex: Bike lvl 2, 6 min  Hooklying bridge x 15 reps  L HS, ITB, mod thomas hip flexor stretch with strap 2 x 30 sec each   Gait training: Stair navigation ascending/descending with mod R rail use; pt. With step-to pattern requiring cueing to avoid descending "sideways" as this was pt. Most comfortable technique;  Pt. Unwilling to attempt step-over-step due to complaint of L medial knee pain at 4/10.      Modalities:  Vasoneumatic compression device: L knee elevated, coldest temp., 10', low compression                  PT Short Term Goals - 05/16/19 1339      PT SHORT TERM GOAL #1   Title  Patient will be independent with initial HEP    Status  Achieved   04/25/19     PT SHORT TERM GOAL #2   Title  Patient will  increase L knee flexion to >/= 120 dg  to promote more normal gait and stair mechanics    Status  Achieved   04/26/19     PT SHORT TERM GOAL #3   Title  Patient will ambulate with normal gait pattern    Status  Partially Met   05/16/19 - Pt. ambulating with improved L TKE and weight shift; still limited with L weight shift and L TKE   Target Date  05/09/19        PT Long Term Goals - 05/16/19 1341      PT LONG TERM GOAL #1   Title  Patient will be independent with ongoing/advanced HEP    Status  Partially Met      PT LONG TERM GOAL #2   Title  Patient will increase L knee flexion to >/= 130 dg to allow for normal gait and stair mechanics    Status  On-going   05/16/19: L knee AROM -2-118 dg     PT LONG TERM GOAL #3   Title  Patient will demonstrate improved L hip and knee strength to >/=  4+/5 for improved stability    Status  Partially Met   05/17/19: met for L knee extenion and L hip flexion     PT LONG TERM GOAL #4   Title  Patient will negotiate stairs reciprocally with normal step pattern w/o limitation due to L knee pain or weakness    Status  On-going   05/16/19:  ambulating with step-to pattern and patient notes this is due to L medial knees pain     PT LONG TERM GOAL #5   Title  Patient to report ability to perform ADLs, household and work-related tasks including climbing in/out of forklift without increased pain    Status  Partially Met   05/16/19: still most limited by L knee pain with work tasks           Plan - 05/16/19 1320    Clinical Impression Statement  Pt. has made good progress with therapy.  Has partially achieved STG #3 today demonstrating improved L TKE and L weight shift although gait mechanics not yet fully normalized.  Pt. making progress toward LTG #2 L knee AROM -2-118 dg with L medial knee pain at end range flexion ROM.  Pt. ttp over Pes Anserine area and pt. offered trial of iontophoresis as MD signed order for ionto however pt. wishing to  try ionto tomorrow when her pants are more conducive.  Pt. verbalized understanding of iontophoresis proper wear time, contraindications, and precautions.  Pt. has partially met LTG #3 met for L hip flexion strength and L knee ext. strength however still grossly 4/5 strength in proximal L hip musculature and L HS strength.  Pt. most limited navigating stairs today demonstrating apprehension and with report of L knee pain causing her to use step-to pattern ascending and descending stairs and requiring correction to avoid "sideways" stair navigation.  LTG #4 ongoing.  Pt. was able to demo L SLS without quad lag today however occasionally seen with gait mechanics reverting to lacking L TKE requiring cueing to correct.  Pt. has partially achieved LTG #5 noting L knee pain/limitation with work related tasks such as climbing in/out of forklift.  Session focused on proximal L hip strengthening to address remaining strength limitations.  Will continue to benefit from further skilled therapy to maximize functional strength and ROM for improved work activity tolerance.    Comorbidities  Metatarsalgia of both feet, B pes cavus & Equinus deformity of feet, HTN, asthma    Rehab Potential  Good    PT Treatment/Interventions  ADLs/Self Care Home Management;Cryotherapy;Electrical Stimulation;Iontophoresis 87m/ml Dexamethasone;Moist Heat;Gait training;Stair training;Functional mobility training;Therapeutic activities;Therapeutic exercise;Balance training;Neuromuscular re-education;Patient/family education;Manual techniques;Passive range of motion;Dry needling;Taping;Joint Manipulations    PT Next Visit Plan  L knee flexion ROM; LE strengthening    PT Home Exercise Plan  04/11/19 - patellar mobs, quad & ITB stretches, SLR; 04/25/19 - bridge/adduction, bridge/abduction into red TB, sidelying clam shell (no resistance), quad set pillow squeeze supine;  05/10/19 - TKE standing with blue band    Consulted and Agree with Plan of Care   Patient       Patient will benefit from skilled therapeutic intervention in order to improve the following deficits and impairments:  Abnormal gait, Decreased activity tolerance, Decreased balance, Decreased endurance, Decreased mobility, Decreased range of motion, Decreased scar mobility, Decreased strength, Difficulty walking, Increased fascial restricitons, Increased muscle spasms, Impaired perceived functional ability, Impaired flexibility, Pain  Visit Diagnosis: Chronic pain of left knee  Stiffness of left knee, not elsewhere classified  Muscle weakness (  generalized)  Other abnormalities of gait and mobility  Difficulty in walking, not elsewhere classified     Problem List Patient Active Problem List   Diagnosis Date Noted  . Asthma with acute exacerbation 12/08/2017  . Seasonal allergic rhinitis due to pollen 07/06/2016  . Mild persistent asthma without complication 32/76/1470  . History of hypertension 07/06/2016  . Wheezing 03/19/2015  . Other allergic rhinitis 03/19/2015  . Equinus deformity of foot, acquired 02/18/2015  . Metatarsalgia of both feet 02/14/2014  . Metatarsal deformity 02/14/2014  . Acquired bilateral pes cavus 02/14/2014    Bess Harvest, PTA 05/16/19 6:01 PM    [facility] [address]  [place], [place], [address] Phone: [phone]   Fax:  [phone]  Name: ISRAEL WUNDER MRN: [id number] Date of Birth: 12-Jul-1965

## 2019-05-19 ENCOUNTER — Ambulatory Visit: Payer: Federal, State, Local not specified - PPO | Attending: Internal Medicine

## 2019-05-19 DIAGNOSIS — Z23 Encounter for immunization: Secondary | ICD-10-CM | POA: Insufficient documentation

## 2019-05-19 NOTE — Progress Notes (Signed)
   Covid-19 Vaccination Clinic  Name:  STEPAHNIE CAMPO    MRN: [id number] DOB: [birth year]  05/19/2019  Ms. Leib was observed post Covid-19 immunization for 15 minutes without incident. She was provided with Vaccine Information Sheet and instruction to access the V-Safe system.   Ms. Brassfield was instructed to call 911 with any severe reactions post vaccine: Marland Kitchen Difficulty breathing  . Swelling of face and throat  . A fast heartbeat  . A bad rash all over body  . Dizziness and weakness   Immunizations Administered    Name Date Dose VIS Date Route   Pfizer COVID-19 Vaccine 05/19/2019  6:37 PM 0.3 mL 02/24/2019 Intramuscular   Manufacturer: [employer], [employer]   Lot: [id number]   NDC: [address]-5

## 2019-05-23 ENCOUNTER — Ambulatory Visit: Payer: Federal, State, Local not specified - PPO | Admitting: Physical Therapy

## 2019-05-23 ENCOUNTER — Encounter: Payer: Self-pay | Admitting: Physical Therapy

## 2019-05-23 ENCOUNTER — Other Ambulatory Visit: Payer: Self-pay | Admitting: Allergy and Immunology

## 2019-05-23 ENCOUNTER — Other Ambulatory Visit: Payer: Self-pay

## 2019-05-23 DIAGNOSIS — G8929 Other chronic pain: Secondary | ICD-10-CM

## 2019-05-23 DIAGNOSIS — R2689 Other abnormalities of gait and mobility: Secondary | ICD-10-CM

## 2019-05-23 DIAGNOSIS — M25562 Pain in left knee: Secondary | ICD-10-CM | POA: Diagnosis not present

## 2019-05-23 DIAGNOSIS — M25662 Stiffness of left knee, not elsewhere classified: Secondary | ICD-10-CM

## 2019-05-23 DIAGNOSIS — R262 Difficulty in walking, not elsewhere classified: Secondary | ICD-10-CM

## 2019-05-23 DIAGNOSIS — M6281 Muscle weakness (generalized): Secondary | ICD-10-CM

## 2019-05-23 NOTE — Therapy (Signed)
[facility] [address]  [place], [place], [address] Phone: [phone]   Fax:  [phone]  Physical Therapy Treatment  Patient Details  Name: Connie Zavala MRN: [id number] Date of Birth: 10/22/1965 Referring Provider (PT): Josiah Lobo, MD   Encounter Date: 05/23/2019  PT End of Session - 05/23/19 1316    Visit Number  12    Number of Visits  16    Date for PT Re-Evaluation  06/06/19    Authorization Type  Federal [employer] - VL: 93    Authorization - Number of Visits  50    PT Start Time  1316    PT Stop Time  1404    PT Time Calculation (min)  48 min    Activity Tolerance  Patient tolerated treatment well    Behavior During Therapy  WFL for tasks assessed/performed       Past Medical History:  Diagnosis Date  . Asthma   . Hypertension     Past Surgical History:  Procedure Laterality Date  . RFA procedure      There were no vitals filed for this visit.  Subjective Assessment - 05/23/19 1320    Subjective  Pt reporting pain remains mostly steady at 4/10 but went up to 6/10 over the weekend while she was working. Pt noting no difference with ionto patch.    Pertinent History  L ACL repair - 11/07/18; fall with closed L tibial plateau fracture & ACL rupture - 08/28/18    Patient Stated Goals  "to get my knee back to where I can bend it fully and not limp anymore"    Currently in Pain?  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)      Knee/Hip Exercises: Sidelying   Clams  L/R clam with red TB 12 x 3"      Manual Therapy   Manual Therapy  Soft tissue mobilization;Myofascial release;Taping    Soft tissue mobilization  STM to all L pes anserine muscles - ttp over gracilis    Myofascial Release  pin & stretch to L gracilis    Kinesiotex  Inhibit Muscle      Kinesiotix   Create Space  L pes anserine - 30% along medial HS, gracilis & sartorius; 30-50% across joint line & patellar tendon               PT Short Term Goals - 05/16/19 1339      PT SHORT TERM GOAL #1   Title  Patient will be independent with initial HEP  Status  Achieved   04/25/19     PT SHORT TERM GOAL #2   Title  Patient will increase L knee flexion to >/= 120 dg  to promote more normal gait and stair mechanics    Status  Achieved   04/26/19     PT SHORT TERM GOAL #3   Title  Patient will ambulate with normal gait pattern    Status  Partially Met   05/16/19 - Pt. ambulating with improved L TKE and weight shift; still limited with L weight shift and L TKE   Target Date  05/09/19        PT Long Term Goals - 05/16/19 1341      PT LONG TERM GOAL #1   Title  Patient will be independent with ongoing/advanced HEP    Status  Partially Met      PT LONG TERM GOAL #2   Title  Patient will increase L knee flexion to >/= 130 dg to allow for normal gait and stair mechanics    Status  On-going   05/16/19: L knee AROM -2-118 dg     PT LONG TERM GOAL #3   Title  Patient will demonstrate improved L hip and knee strength to >/= 4+/5 for improved stability    Status  Partially Met   05/17/19: met for L knee extenion and L hip flexion     PT LONG TERM GOAL #4   Title  Patient will negotiate stairs reciprocally with normal step pattern w/o limitation due to L knee pain or weakness    Status  On-going    05/16/19:  ambulating with step-to pattern and patient notes this is due to L medial knees pain     PT LONG TERM GOAL #5   Title  Patient to report ability to perform ADLs, household and work-related tasks including climbing in/out of forklift without increased pain    Status  Partially Met   05/16/19: still most limited by L knee pain with work tasks           Plan - 05/23/19 1324    Clinical Impression Statement  Connie Zavala reporting fairly constant L knee pain at 4/10 on average with pain remaining localized to medial and lateral joint line as well as pes anserine. She notes no benefit from ionto patch last visit. She feels pain remains elevated due to repeated climbing in/out of forklift often feeling like she is landing hard on the L leg when stepping down out of the forklift. Therapeutic exercises today focusing on eccentric control with step-downs to lessen impact when climbing in/out of forklift. Also trialed kinesiotape application for pes anserine and joint line pain and will assess response next visit.    Personal Factors and Comorbidities  Time since onset of injury/illness/exacerbation;Past/Current Experience;Comorbidity 3+    Comorbidities  Metatarsalgia of both feet, B pes cavus & Equinus deformity of feet, HTN, asthma    Examination-Activity Limitations  Bend;Lift;Locomotion Level;Sit;Stand;Squat;Transfers    Examination-Participation Restrictions  Other   work restricted to 8 hr shifts, 5 days per week - no overtime allowed   Rehab Potential  Good    PT Frequency  2x / week    PT Duration  8 weeks    PT Treatment/Interventions  ADLs/Self Care Home Management;Cryotherapy;Electrical Stimulation;Iontophoresis 7m/ml Dexamethasone;Moist Heat;Gait training;Stair training;Functional mobility training;Therapeutic activities;Therapeutic exercise;Balance training;Neuromuscular re-education;Patient/family education;Manual techniques;Passive range of motion;Dry needling;Taping;Joint  Manipulations    PT Next Visit Plan  assess response to taping; L knee flexion ROM;  LE strengthening    PT Home Exercise Plan  04/11/19 - patellar mobs, quad & ITB stretches, SLR; 04/25/19 - bridge/adduction, bridge/abduction into red TB, sidelying clam shell (no resistance), quad set pillow squeeze supine;  05/10/19 - TKE standing with blue band;  05/17/19 - 4-way hip kicker with yellow TB    Consulted and Agree with Plan of Care  Patient       Patient will benefit from skilled therapeutic intervention in order to improve the following deficits and impairments:  Abnormal gait, Decreased activity tolerance, Decreased balance, Decreased endurance, Decreased mobility, Decreased range of motion, Decreased scar mobility, Decreased strength, Difficulty walking, Increased fascial restricitons, Increased muscle spasms, Impaired perceived functional ability, Impaired flexibility, Pain  Visit Diagnosis: Chronic pain of left knee  Stiffness of left knee, not elsewhere classified  Muscle weakness (generalized)  Other abnormalities of gait and mobility  Difficulty in walking, not elsewhere classified     Problem List Patient Active Problem List   Diagnosis Date Noted  . Asthma with acute exacerbation 12/08/2017  . Seasonal allergic rhinitis due to pollen 07/06/2016  . Mild persistent asthma without complication 48/47/2072  . History of hypertension 07/06/2016  . Wheezing 03/19/2015  . Other allergic rhinitis 03/19/2015  . Equinus deformity of foot, acquired 02/18/2015  . Metatarsalgia of both feet 02/14/2014  . Metatarsal deformity 02/14/2014  . Acquired bilateral pes cavus 02/14/2014    Percival Spanish, PT, MPT 05/23/2019, 6:27 PM  [facility] [address]  [place], [place], [address] Phone: [phone]   Fax:  [phone]  Name: Connie Zavala MRN: [id number] Date of Birth: 1965-03-20

## 2019-05-23 NOTE — Telephone Encounter (Signed)
Last ov 12/08/2017.

## 2019-05-23 NOTE — Telephone Encounter (Signed)
Pt needs ov for any refills. Denied montelukast. Pt last seen 11/2017.

## 2019-05-24 ENCOUNTER — Ambulatory Visit: Payer: Federal, State, Local not specified - PPO

## 2019-05-24 DIAGNOSIS — R262 Difficulty in walking, not elsewhere classified: Secondary | ICD-10-CM

## 2019-05-24 DIAGNOSIS — R2689 Other abnormalities of gait and mobility: Secondary | ICD-10-CM

## 2019-05-24 DIAGNOSIS — M25662 Stiffness of left knee, not elsewhere classified: Secondary | ICD-10-CM

## 2019-05-24 DIAGNOSIS — M6281 Muscle weakness (generalized): Secondary | ICD-10-CM

## 2019-05-24 DIAGNOSIS — M25562 Pain in left knee: Secondary | ICD-10-CM | POA: Diagnosis not present

## 2019-05-24 DIAGNOSIS — G8929 Other chronic pain: Secondary | ICD-10-CM

## 2019-05-24 NOTE — Therapy (Signed)
[facility] [address]  [place], [place], [address] Phone: [phone]   Fax:  [phone]  Physical Therapy Treatment  Patient Details  Name: Connie Zavala MRN: [id number] Date of Birth: 1965-12-27 Referring Provider (PT): Josiah Lobo, MD   Encounter Date: 05/24/2019  PT End of Session - 05/24/19 1323    Visit Number  13    Number of Visits  16    Date for PT Re-Evaluation  06/06/19    Authorization Type  Federal [employer] - VL: 42    Authorization - Number of Visits  50    PT Start Time  1316    PT Stop Time  1410    PT Time Calculation (min)  54 min    Activity Tolerance  Patient tolerated treatment well    Behavior During Therapy  WFL for tasks assessed/performed       Past Medical History:  Diagnosis Date  . Asthma   . Hypertension     Past Surgical History:  Procedure Laterality Date  . RFA procedure      There were no vitals filed for this visit.  Subjective Assessment - 05/24/19 1321    Subjective  Pt. denies soreness from yesterday.  taping still intact from yesterday at L knee.    Pertinent History  L ACL repair - 11/07/18; fall with closed L tibial plateau fracture & ACL rupture - 08/28/18    Patient Stated Goals  "to get my knee back to where I can bend it fully and not limp anymore"    Currently in Pain?  No/denies    Pain Score  5     Pain Location  Knee    Pain Orientation  Left    Pain Descriptors / Indicators  Dull;Aching    Pain Type  Surgical pain;Acute pain    Pain Onset  More than a month ago    Multiple Pain Sites  No         [facility] PT Assessment - 05/24/19 0001      AROM   Left Knee Flexion  122                   [facility] Adult PT Treatment/Exercise - 05/24/19 0001      Knee/Hip Exercises: Stretches   Sports administrator  Left;1 rep;60 seconds    Quad Stretch Limitations  prone with strap     Other Knee/Hip Stretches  Seated B groin stretch with hands on knees 2 x  30 sec       Knee/Hip Exercises: Aerobic   Nustep  Lvl 4, 6 min (UE/LE)      Knee/Hip Exercises: Machines for Strengthening   Cybex Leg Press  R LE 25# x 10, L LE 20# x12      Knee/Hip Exercises: Standing   Forward Step Up  Left;Right;10 reps;Hand Hold: 1    Forward Step Up Limitations  7" step up;     Step Down  Left;10 reps;Step Height: 4";Hand Hold: 2    Step Down Limitations  lateral eccentric touch down       Knee/Hip Exercises: Supine   Bridges  Both;15 reps    Bridges Limitations  5" hold with therapist counting    therapist keeping pace and encouraging increased ext ROM   Straight Leg Raises  Left;15 reps;Strengthening      Vasopneumatic   Number Minutes Vasopneumatic   10 minutes    Vasopnuematic Location  Knee    Vasopneumatic Pressure  Medium    Vasopneumatic Temperature   34dg      Manual Therapy   Manual Therapy  Joint mobilization    Joint Mobilization  L patellar mobs all directions for improved ROM               PT Short Term Goals - 05/16/19 1339      PT SHORT TERM GOAL #1   Title  Patient will be independent with initial HEP    Status  Achieved   04/25/19     PT SHORT TERM GOAL #2   Title  Patient will increase L knee flexion to >/= 120 dg  to promote more normal gait and stair mechanics    Status  Achieved   04/26/19     PT SHORT TERM GOAL #3   Title  Patient will ambulate with normal gait pattern    Status  Partially Met   05/16/19 - Pt. ambulating with improved L TKE and weight shift; still limited with L weight shift and L TKE   Target Date  05/09/19        PT Long Term Goals - 05/16/19 1341      PT LONG TERM GOAL #1   Title  Patient will be independent with ongoing/advanced HEP    Status  Partially Met      PT LONG TERM GOAL #2   Title  Patient will increase L knee flexion to >/= 130 dg to allow for normal gait and stair mechanics    Status  On-going   05/16/19: L knee AROM -2-118 dg     PT LONG TERM GOAL #3   Title   Patient will demonstrate improved L hip and knee strength to >/= 4+/5 for improved stability    Status  Partially Met   05/17/19: met for L knee extenion and L hip flexion     PT LONG TERM GOAL #4   Title  Patient will negotiate stairs reciprocally with normal step pattern w/o limitation due to L knee pain or weakness    Status  On-going   05/16/19:  ambulating with step-to pattern and patient notes this is due to L medial knees pain     PT LONG TERM GOAL #5   Title  Patient to report ability to perform ADLs, household and work-related tasks 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 reporting taping still intact on L knee from yesterday.  Denies soreness after yesterday's session.  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  Pt. tolerated all stepping activities well without pain however still very challenged to descend slowly under control.  Improved technique with bridging with therapist providing proper pacing and to ensure full hip extension.  Some visible L knee swelling to end session thus applied ice/compression.    Comorbidities  Metatarsalgia of both feet, B pes cavus & Equinus deformity of feet, HTN, asthma    Rehab Potential  Good    PT Treatment/Interventions  ADLs/Self Care Home Management;Cryotherapy;Electrical Stimulation;Iontophoresis '4mg'$ /ml Dexamethasone;Moist Heat;Gait training;Stair training;Functional mobility training;Therapeutic activities;Therapeutic exercise;Balance training;Neuromuscular re-education;Patient/family education;Manual techniques;Passive range of motion;Dry needling;Taping;Joint Manipulations    PT  Next Visit Plan  Reapply tape as benefit noted; L knee flexion ROM; LE strengthening     PT Home Exercise Plan  04/11/19 - patellar mobs, quad & ITB stretches, SLR; 04/25/19 - bridge/adduction, bridge/abduction into red TB, sidelying clam shell (no resistance), quad set pillow squeeze supine;  05/10/19 - TKE standing with blue band;  05/17/19 - 4-way hip kicker with yellow TB    Consulted and Agree with Plan of Care  Patient       Patient will benefit from skilled therapeutic intervention in order to improve the following deficits and impairments:  Abnormal gait, Decreased activity tolerance, Decreased balance, Decreased endurance, Decreased mobility, Decreased range of motion, Decreased scar mobility, Decreased strength, Difficulty walking, Increased fascial restricitons, Increased muscle spasms, Impaired perceived functional ability, Impaired flexibility, Pain  Visit Diagnosis: Chronic pain of left knee  Stiffness of left knee, not elsewhere classified  Muscle weakness (generalized)  Other abnormalities of gait and mobility  Difficulty in walking, not elsewhere classified     Problem List Patient Active Problem List   Diagnosis Date Noted  . Asthma with acute exacerbation 12/08/2017  . Seasonal allergic rhinitis due to pollen 07/06/2016  . Mild persistent asthma without complication 24/79/9800  . History of hypertension 07/06/2016  . Wheezing 03/19/2015  . Other allergic rhinitis 03/19/2015  . Equinus deformity of foot, acquired 02/18/2015  . Metatarsalgia of both feet 02/14/2014  . Metatarsal deformity 02/14/2014  . Acquired bilateral pes cavus 02/14/2014    Bess Harvest, PTA 05/24/19 3:19 PM   [facility] [address]  [place], [place], [address] Phone: [phone]   Fax:  [phone]  Name: Connie Zavala MRN: [id number] Date of Birth: June 17, 1965

## 2019-05-30 ENCOUNTER — Encounter: Payer: Self-pay | Admitting: Physical Therapy

## 2019-05-30 ENCOUNTER — Other Ambulatory Visit: Payer: Self-pay

## 2019-05-30 ENCOUNTER — Ambulatory Visit: Payer: Federal, State, Local not specified - PPO | Admitting: Physical Therapy

## 2019-05-30 DIAGNOSIS — M25562 Pain in left knee: Secondary | ICD-10-CM

## 2019-05-30 DIAGNOSIS — R262 Difficulty in walking, not elsewhere classified: Secondary | ICD-10-CM

## 2019-05-30 DIAGNOSIS — G8929 Other chronic pain: Secondary | ICD-10-CM

## 2019-05-30 DIAGNOSIS — M25662 Stiffness of left knee, not elsewhere classified: Secondary | ICD-10-CM

## 2019-05-30 DIAGNOSIS — R2689 Other abnormalities of gait and mobility: Secondary | ICD-10-CM

## 2019-05-30 DIAGNOSIS — M6281 Muscle weakness (generalized): Secondary | ICD-10-CM

## 2019-05-30 NOTE — Therapy (Signed)
[facility] [address]  [place], [place], [address] Phone: [phone]   Fax:  [phone]  Physical Therapy Treatment  Patient Details  Name: Connie Zavala MRN: [id number] Date of Birth: April 22, 1965 Referring Provider (PT): Josiah Lobo, MD   Encounter Date: 05/30/2019  PT End of Session - 05/30/19 1314    Visit Number  14    Number of Visits  16    Date for PT Re-Evaluation  06/06/19    Authorization Type  [employer] - VL: [id number]    Authorization - Number of Visits  50    PT Start Time  6314    PT Stop Time  1412    PT Time Calculation (min)  58 min    Activity Tolerance  Patient tolerated treatment well    Behavior During Therapy  WFL for tasks assessed/performed       Past Medical History:  Diagnosis Date  . Asthma   . Hypertension     Past Surgical History:  Procedure Laterality Date  . RFA procedure      There were no vitals filed for this visit.  Subjective Assessment - 05/30/19 1316    Subjective  Pt reports it has been a slow day for her so far so pain is minimal. Does not feel taping from last week helped much and tape made her itchy.    Pertinent History  L ACL repair - 11/07/18; fall with closed L tibial plateau fracture & ACL rupture - 08/28/18    Patient Stated Goals  "to get my knee back to where I can bend it fully and not limp anymore"    Currently in Pain?  Yes    Pain Score  3     Pain Location  Knee    Pain Orientation  Left    Pain Descriptors / Indicators  Dull;Aching    Pain Type  Surgical pain;Acute pain    Pain Frequency  Constant                       [facility] Adult PT Treatment/Exercise - 05/30/19 1314      Knee/Hip Exercises: Aerobic   Recumbent Bike  L2 x 6 min      Knee/Hip Exercises: Standing   Hip Flexion  Right;Left;10 reps;Stengthening;Knee straight    Hip Flexion Limitations  red TB + opp SLS on blue foam oval, cues to avoid genu recurvatum on  stance leg; 1 pole A for balance    Forward Lunges Limitations  attempted but unable to achieve good alignment and technique & increased L knee pain reported, therefore deferred    Side Lunges  Left;Right;5 reps;2 seconds    Side Lunges Limitations  attempted with TRX but unable to achieve good alignment and technique on L & increased L knee pain reported, therefore deferred    Hip ADduction  Right;Left;10 reps;Strengthening    Hip ADduction Limitations  red TB + opp SLS on blue foam oval, cues to avoid genu recurvatum on stance leg; 1 pole A for balance    Hip Abduction  Right;Left;10 reps;Stengthening;Knee straight    Abduction Limitations  red TB + opp SLS on blue foam oval, cues to avoid genu recurvatum on stance leg; 1 pole A for balance    Hip Extension  Right;Left;10 reps;Stengthening;Knee straight    Extension Limitations  red TB + opp SLS on blue foam oval, cues to avoid genu recurvatum  on stance leg; 1 pole A for balance    Step Down  Left;10 reps;2 sets;Step Height: 4";Hand Hold: 2;Hand Hold: 1    Step Down Limitations  lateral & forward eccentric heel touch down       Modalities   Modalities  Vasopneumatic      Vasopneumatic   Number Minutes Vasopneumatic   15 minutes    Vasopnuematic Location   Knee    Vasopneumatic Pressure  Medium    Vasopneumatic Temperature   34 dg               PT Short Term Goals - 05/30/19 1321      PT SHORT TERM GOAL #1   Title  Patient will be independent with initial HEP    Status  Achieved   04/25/19     PT SHORT TERM GOAL #2   Title  Patient will increase L knee flexion to >/= 120 dg  to promote more normal gait and stair mechanics    Status  Achieved   04/26/19     PT SHORT TERM GOAL #3   Title  Patient will ambulate with normal gait pattern    Status  Achieved   05/30/19       PT Long Term Goals - 05/16/19 1341      PT LONG TERM GOAL #1   Title  Patient will be independent with ongoing/advanced HEP    Status  Partially  Met      PT LONG TERM GOAL #2   Title  Patient will increase L knee flexion to >/= 130 dg to allow for normal gait and stair mechanics    Status  On-going   05/16/19: L knee AROM -2-118 dg     PT LONG TERM GOAL #3   Title  Patient will demonstrate improved L hip and knee strength to >/= 4+/5 for improved stability    Status  Partially Met   05/17/19: met for L knee extenion and L hip flexion     PT LONG TERM GOAL #4   Title  Patient will negotiate stairs reciprocally with normal step pattern w/o limitation due to L knee pain or weakness    Status  On-going   05/16/19:  ambulating with step-to pattern and patient notes this is due to L medial knees pain     PT LONG TERM GOAL #5   Title  Patient to report ability to perform ADLs, household and 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 She continues to note near constant pain with fluctuating edema depending on the degree of activity. Limited tolerance for exercise progression today due to poor coordination of movement preventing good technique with exercises and resulting in increased knee pain. Session concluded with vasopnuematic compression to reduce pain and edema. Given ongoing pain and slow progress with PT, anticipate recert at end of current POC.    Comorbidities  Metatarsalgia of both feet, B pes cavus & Equinus deformity of feet, HTN, asthma    Rehab Potential  Good    PT Frequency  2x / week    PT Duration  8 weeks    PT Treatment/Interventions  ADLs/Self Care Home Management;Cryotherapy;Electrical Stimulation;Iontophoresis 87m/ml Dexamethasone;Moist Heat;Gait training;Stair training;Functional mobility  training;Therapeutic activities;Therapeutic exercise;Balance training;Neuromuscular re-education;Patient/family education;Manual techniques;Passive range of motion;Dry needling;Taping;Joint Manipulations    PT Next Visit Plan  L knee flexion ROM; LE strengthening    PT Home Exercise Plan  04/11/19 - patellar mobs, quad & ITB stretches, SLR; 04/25/19 - bridge/adduction, bridge/abduction into red TB, sidelying clam shell (no resistance), quad set pillow squeeze supine;  05/10/19 - TKE standing with blue band;  05/17/19 - 4-way hip kicker with yellow TB    Consulted and Agree with Plan of Care  Patient       Patient will benefit from skilled therapeutic intervention in order to improve the following deficits and impairments:  Abnormal gait, Decreased activity tolerance, Decreased balance, Decreased endurance, Decreased mobility, Decreased range of motion, Decreased scar mobility, Decreased strength, Difficulty walking, Increased fascial restricitons, Increased muscle spasms, Impaired perceived functional ability, Impaired flexibility, Pain  Visit Diagnosis: Chronic pain of left knee  Stiffness of left knee, not elsewhere classified  Muscle weakness (generalized)  Other abnormalities of gait and mobility  Difficulty in walking, not elsewhere classified     Problem List Patient Active Problem List   Diagnosis Date Noted  . Asthma with acute exacerbation 12/08/2017  . Seasonal allergic rhinitis due to pollen 07/06/2016  . Mild persistent asthma without complication 061/95/0932 . History of hypertension 07/06/2016  . Wheezing 03/19/2015  . Other allergic rhinitis 03/19/2015  . Equinus deformity of foot, acquired 02/18/2015  . Metatarsalgia of both feet 02/14/2014  . Metatarsal deformity 02/14/2014  . Acquired bilateral pes cavus 02/14/2014    JPercival Spanish PT, MPT 05/30/2019, 2:09 PM  [facility] [place] [address]Phone: [phone]  Fax:  [phone] Name: Connie WYSONGMRN: [id number] of Birth: 512-31-1967

## 2019-05-31 ENCOUNTER — Ambulatory Visit: Payer: Federal, State, Local not specified - PPO

## 2019-05-31 DIAGNOSIS — M6281 Muscle weakness (generalized): Secondary | ICD-10-CM

## 2019-05-31 DIAGNOSIS — G8929 Other chronic pain: Secondary | ICD-10-CM

## 2019-05-31 DIAGNOSIS — M25562 Pain in left knee: Secondary | ICD-10-CM | POA: Diagnosis not present

## 2019-05-31 DIAGNOSIS — R262 Difficulty in walking, not elsewhere classified: Secondary | ICD-10-CM

## 2019-05-31 DIAGNOSIS — M25662 Stiffness of left knee, not elsewhere classified: Secondary | ICD-10-CM

## 2019-05-31 DIAGNOSIS — R2689 Other abnormalities of gait and mobility: Secondary | ICD-10-CM

## 2019-05-31 NOTE — Therapy (Signed)
[facility] [address]  [place], [place], [address] Phone: [phone]   Fax:  [phone]  Physical Therapy Treatment  Patient Details  Name: Connie Zavala MRN: [id number] Date of Birth: Jul 18, 1965 Referring Provider (PT): Josiah Lobo, MD   Encounter Date: 05/31/2019  PT End of Session - 05/31/19 1321    Visit Number  15    Number of Visits  16    Date for PT Re-Evaluation  06/06/19    Authorization Type  [employer] - VL: 43    Authorization - Number of Visits  50    PT Start Time  3428    PT Stop Time  1359    PT Time Calculation (min)  44 min    Activity Tolerance  Patient tolerated treatment well    Behavior During Therapy  WFL for tasks assessed/performed       Past Medical History:  Diagnosis Date  . Asthma   . Hypertension     Past Surgical History:  Procedure Laterality Date  . RFA procedure      There were no vitals filed for this visit.  Subjective Assessment - 05/31/19 1318    Subjective  Pt. reporting she feels side lunge with TRX cable at therapy yesterday made her L knee sore.    Pertinent History  L ACL repair - 11/07/18; fall with closed L tibial plateau fracture & ACL rupture - 08/28/18    Patient Stated Goals  "to get my knee back to where I can bend it fully and not limp anymore"    Currently in Pain?  Yes    Pain Score  3    Pain rising to 10/10 at times at worst   Pain Location  Knee    Pain Orientation  Left    Pain Descriptors / Indicators  Dull;Aching    Pain Type  Surgical pain;Acute pain    Pain Onset  More than a month ago    Pain Frequency  Constant    Multiple Pain Sites  No                       [facility] Adult PT Treatment/Exercise - 05/31/19 0001      Knee/Hip Exercises: Aerobic   Recumbent Bike  L2 x 6 min      Knee/Hip Exercises: Standing   Step Down  Left;10 reps;1 set;Step Height: 4";Hand Hold: 2    Step Down Limitations  lateral heel  touch     Functional Squat  15 reps;3 seconds    Functional Squat Limitations  TRX support + green TB at knees and iso hip abd/ER into band    SLS with Vectors  L SLS + R toe touch in staggered stance + red TB pallof press closed in dor x 10 reps each way     Other Standing Knee Exercises  Side stepping with green looped TB at mid thigh at counter x 2 laps       Knee/Hip Exercises: Supine   Bridges with Diona Foley Squeeze  Both;15 reps;Strengthening   + adduction ball squeeze      Knee/Hip Exercises: Sidelying   Hip ADduction  Left;2 sets;10 reps;Strengthening    Hip ADduction Limitations  1#               PT Short Term Goals - 05/30/19 1321      PT SHORT TERM GOAL #1   Title  Patient will be independent with initial HEP    Status  Achieved   04/25/19     PT SHORT TERM GOAL #2   Title  Patient will increase L knee flexion to >/= 120 dg  to promote more normal gait and stair mechanics    Status  Achieved   04/26/19     PT SHORT TERM GOAL #3   Title  Patient will ambulate with normal gait pattern    Status  Achieved   05/30/19       PT Long Term Goals - 05/16/19 1341      PT LONG TERM GOAL #1   Title  Patient will be independent with ongoing/advanced HEP    Status  Partially Met      PT LONG TERM GOAL #2   Title  Patient will increase L knee flexion to >/= 130 dg to allow for normal gait and stair mechanics    Status  On-going   05/16/19: L knee AROM -2-118 dg     PT LONG TERM GOAL #3   Title  Patient will demonstrate improved L hip and knee strength to >/= 4+/5 for improved stability    Status  Partially Met   05/17/19: met for L knee extenion and L hip flexion     PT LONG TERM GOAL #4   Title  Patient will negotiate stairs reciprocally with normal step pattern w/o limitation due to L knee pain or weakness    Status  On-going   05/16/19:  ambulating with step-to pattern and patient notes this is due to L medial knees pain     PT LONG TERM GOAL #5   Title   Patient to report ability to perform ADLs, household and 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  Pt. noted that this soreness has improved today.  Was able to tolerate progression of hip adductor/quad strengthening and continued 4" lateral step-down with some increased L knee soreness which resolved with rest.  Pt. 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  Ended visit with pt. declining modalities and reporting pain resolving to baseline.    Comorbidities  Metatarsalgia of both feet, B pes cavus & Equinus deformity of feet, HTN, asthma    Rehab Potential  Good    PT Treatment/Interventions  ADLs/Self Care Home Management;Cryotherapy;Electrical Stimulation;Iontophoresis 19m/ml Dexamethasone;Moist Heat;Gait training;Stair training;Functional mobility training;Therapeutic activities;Therapeutic exercise;Balance training;Neuromuscular re-education;Patient/family education;Manual techniques;Passive range of motion;Dry needling;Taping;Joint Manipulations    PT Next Visit Plan  L knee flexion ROM; LE strengthening    PT Home Exercise Plan  04/11/19 - patellar mobs, quad & ITB stretches, SLR; 04/25/19 - bridge/adduction, bridge/abduction into red TB, sidelying clam shell (no resistance), quad set pillow squeeze supine;  05/10/19 - TKE standing with blue band;  05/17/19 - 4-way hip kicker with yellow TB    Consulted and Agree with Plan of Care  Patient       Patient will benefit from skilled therapeutic intervention in order to improve the following deficits and  impairments:  Abnormal gait, Decreased activity tolerance, Decreased balance, Decreased endurance, Decreased mobility, Decreased range of motion, Decreased scar mobility, Decreased strength, Difficulty walking, Increased fascial restricitons, Increased muscle spasms, Impaired perceived functional ability, Impaired flexibility, Pain  Visit Diagnosis: Chronic pain of left knee  Stiffness of left knee, not elsewhere classified  Muscle weakness (generalized)  Other abnormalities of gait and mobility  Difficulty in walking, not elsewhere classified     Problem List Patient Active Problem List   Diagnosis Date Noted  . Asthma with acute exacerbation 12/08/2017  . Seasonal allergic rhinitis due to pollen 07/06/2016  . Mild persistent asthma without complication 75/91/6384  . History of hypertension 07/06/2016  . Wheezing 03/19/2015  . Other allergic rhinitis 03/19/2015  . Equinus deformity of foot, acquired 02/18/2015  . Metatarsalgia of both feet 02/14/2014  . Metatarsal deformity 02/14/2014  . Acquired bilateral pes cavus 02/14/2014    Bess Harvest, PTA 05/31/19 3:28 PM   [facility] [address]  [place], [place], [address] Phone: [phone]   Fax:  [phone]  Name: Connie Zavala MRN: [id number] Date of Birth: [birth year]

## 2019-06-06 ENCOUNTER — Ambulatory Visit: Payer: Federal, State, Local not specified - PPO | Admitting: Physical Therapy

## 2019-06-06 ENCOUNTER — Other Ambulatory Visit: Payer: Self-pay

## 2019-06-06 ENCOUNTER — Encounter: Payer: Self-pay | Admitting: Physical Therapy

## 2019-06-06 DIAGNOSIS — R262 Difficulty in walking, not elsewhere classified: Secondary | ICD-10-CM

## 2019-06-06 DIAGNOSIS — G8929 Other chronic pain: Secondary | ICD-10-CM

## 2019-06-06 DIAGNOSIS — M25562 Pain in left knee: Secondary | ICD-10-CM | POA: Diagnosis not present

## 2019-06-06 DIAGNOSIS — M6281 Muscle weakness (generalized): Secondary | ICD-10-CM

## 2019-06-06 DIAGNOSIS — M25662 Stiffness of left knee, not elsewhere classified: Secondary | ICD-10-CM

## 2019-06-06 DIAGNOSIS — R2689 Other abnormalities of gait and mobility: Secondary | ICD-10-CM

## 2019-06-06 NOTE — Therapy (Signed)
[facility] [address]  [place], [place], [address] Phone: [phone]   Fax:  [phone]  Physical Therapy Treatment / Recert  Patient Details  Name: Connie Zavala MRN: [id number] Date of Birth: 1965/08/08 Referring Provider (PT): Josiah Lobo, MD   Encounter Date: 06/06/2019  PT End of Session - 06/06/19 1313    Visit Number  16    Number of Visits  28    Date for PT Re-Evaluation  07/18/19    Authorization Type  Federal [employer] - VL: [id number]    Authorization - Number of Visits  50    PT Start Time  1313    PT Stop Time  1401    PT Time Calculation (min)  48 min    Activity Tolerance  Patient tolerated treatment well    Behavior During Therapy  WFL for tasks assessed/performed       Past Medical History:  Diagnosis Date  . Asthma   . Hypertension     Past Surgical History:  Procedure Laterality Date  . RFA procedure      There were no vitals filed for this visit.  Subjective Assessment - 06/06/19 1318    Subjective  Pt reports continued constant dull ache but also notes intermittent sharp pain across the front of the L knee over past week.    Pertinent History  L ACL repair - 11/07/18; fall with closed L tibial plateau fracture & ACL rupture - 08/28/18    Patient Stated Goals  "to get my knee back to where I can bend it fully and not limp anymore"    Currently in Pain?  Yes    Pain Score  4     Pain Location  Knee    Pain Orientation  Left    Pain Descriptors / Indicators  Dull;Aching    Pain Type  Acute pain;Surgical pain    Pain Frequency  Constant         [facility] PT Assessment - 06/06/19 1313      Assessment   Medical Diagnosis  L knee pain s/p ACL reconstruction    Referring Provider (PT)  Josiah Lobo, MD    Onset Date/Surgical Date  11/07/18    Hand Dominance  Right    Next MD Visit  06/19/19      AROM   Left Knee Extension  -3    Left Knee Flexion  120      Strength   Right Hip  Flexion  5/5    Right Hip Extension  5/5    Right Hip External Rotation   4+/5    Right Hip Internal Rotation  5/5    Right Hip ABduction  4/5    Right Hip ADduction  4/5    Left Hip Flexion  5/5    Left Hip Extension  4+/5    Left Hip External Rotation  4-/5    Left Hip Internal Rotation  4+/5    Left Hip ABduction  4/5    Left Hip ADduction  4-/5    Right Knee Flexion  5/5    Right Knee Extension  5/5    Left Knee Flexion  4/5    Left Knee Extension  4+/5      Ambulation/Gait   Assistive device  None    Gait Pattern  Step-through pattern;Decreased hip/knee flexion - left;Decreased weight shift to left;Decreased stance time - left;[place]  5: Supervision    Stair Management Technique  One rail Right;Step to pattern;Forwards;Backwards;Sideways    Number of Stairs  14    Height of Stairs  7    Gait Comments  Patient extremely reluctant to attempt alternating pattern on stair ascent and demonstrates significant antalgic pattern when leading with L foot to the point where her knee appears to start to buckle. On descent she prefers backwards step-to pattern but when attempted forward she converts to sideways step-to pattern.                   [facility] Adult PT Treatment/Exercise - 06/06/19 1313      Knee/Hip Exercises: Stretches   Other Knee/Hip Stretches  Foam rolling to quads x 1 min   FR on mat table d/t difficulty with floor transfer     Knee/Hip Exercises: Aerobic   Recumbent Bike  L2 x 6 min      Manual Therapy   Manual Therapy  Joint mobilization;Soft tissue mobilization;Myofascial release    Manual therapy comments  supine     Joint Mobilization  L patellar mobs all directions for improved ROM    Soft tissue mobilization  STM to L gracilis, quads and ITB    Myofascial Release  L pin & stretch to gracilis, mid and lateral quads and ITB             PT Education - 06/06/19 1358    Education Details  Potential role of DN and expected  response    Methods  Explanation;Handout    Comprehension  Verbalized understanding       PT Short Term Goals - 05/30/19 1321      PT SHORT TERM GOAL #1   Title  Patient will be independent with initial HEP    Status  Achieved   04/25/19     PT SHORT TERM GOAL #2   Title  Patient will increase L knee flexion to >/= 120 dg  to promote more normal gait and stair mechanics    Status  Achieved   04/26/19     PT SHORT TERM GOAL #3   Title  Patient will ambulate with normal gait pattern    Status  Achieved   05/30/19       PT Long Term Goals - 06/06/19 1321      PT LONG TERM GOAL #1   Title  Patient will be independent with ongoing/advanced HEP    Status  Partially Met   06/06/19 - met for current HEP   Target Date  07/18/19      PT LONG TERM GOAL #2   Title  Patient will increase L knee flexion to >/= 130 dg to allow for normal gait and stair mechanics    Status  On-going    Target Date  07/18/19      PT LONG TERM GOAL #3   Title  Patient will demonstrate improved L hip and knee strength to >/= 4+/5 for improved stability    Status  Partially Met    Target Date  07/18/19      PT LONG TERM GOAL #4   Title  Patient will negotiate stairs reciprocally with normal step pattern w/o limitation due to L knee pain or weakness    Status  On-going   05/16/19:  ambulating with step-to pattern and patient notes this is due to L medial knees pain   Target Date  07/18/19      PT LONG TERM  GOAL #5   Title  Patient to report ability to perform ADLs, household and 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 Her pain has not responded to trials of ionto patches or taping and only temporary relief reported with cryotherapy or vasopnuematic pressure. L knee ROM into extension w/o limit but flexion remains limited to ~120 dg due to pain and ongoing quad tightness. Taut bands and TPs persist throughout L quads, hip adductors and ITB despite manual therapy and reports of consistent stretching and self-STM/rolling at home - have discussed possibility of DN and patient currently considering this. Overall B LE strength has improved however L LE remains weaker than R with greatest weakness in eccentric quad control, hip adduction and ER. Pain and weakness continue to limit patient functionally with altered gait pattern and cause her to navigate stairs with only step-to pattern on ascent and reverse or sideways step-to pattern on descent. 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 Given ongoing deficits, LTGs only partially met or ongoing at this time. Connie Zavala wishes to continue with PT, therefore will recert for additional 2x/wk for up to 6 weeks with patient scheduled to follow up with MD on 06/19/19.    Personal Factors and Comorbidities  Time since onset of injury/illness/exacerbation;Past/Current Experience;Comorbidity 3+    Comorbidities  Metatarsalgia of both feet, B pes cavus & Equinus deformity of feet, HTN, asthma    Examination-Activity Limitations  Bend;Lift;Locomotion Level;Sit;Stand;Squat;Transfers;Stairs    Examination-Participation Restrictions  Other   work restricted to 8 hr shifts, 5 days per week - no overtime allowed   Rehab Potential  Good    PT Frequency  2x / week    PT Duration  6 weeks    PT Treatment/Interventions  ADLs/Self Care Home Management;Cryotherapy;Electrical Stimulation;Iontophoresis 34m/ml Dexamethasone;Moist Heat;Gait training;Stair training;Functional mobility training;Therapeutic activities;Therapeutic  exercise;Balance training;Neuromuscular re-education;Patient/family education;Manual techniques;Passive range of motion;Dry needling;Taping;Joint Manipulations    PT Next Visit Plan  L knee flexion ROM; LE strengthening    PT Home Exercise Plan  04/11/19 - patellar mobs, quad & ITB stretches, SLR; 04/25/19 - bridge/adduction, bridge/abduction into red TB, sidelying clam shell (no resistance), quad set pillow squeeze supine;  05/10/19 - TKE standing with blue band;  05/17/19 - 4-way hip kicker with yellow TB    Consulted and Agree with Plan of Care  Patient       Patient will benefit from skilled therapeutic intervention in order to improve the following deficits and impairments:  Abnormal gait, Decreased activity tolerance, Decreased balance, Decreased endurance, Decreased mobility, Decreased range of motion, Decreased scar mobility, Decreased strength, Difficulty walking, Increased fascial restricitons, Increased muscle spasms, Impaired perceived functional ability, Impaired flexibility, Pain  Visit Diagnosis: Chronic pain of left knee  Stiffness of left knee, not elsewhere classified  Muscle weakness (generalized)  Other abnormalities of gait and mobility  Difficulty in walking, not elsewhere classified     Problem List Patient Active Problem List   Diagnosis Date Noted  . Asthma with acute exacerbation  12/08/2017  . Seasonal allergic rhinitis due to pollen 07/06/2016  . Mild persistent asthma without complication 93/90/3009  . History of hypertension 07/06/2016  . Wheezing 03/19/2015  . Other allergic rhinitis 03/19/2015  . Equinus deformity of foot, acquired 02/18/2015  . Metatarsalgia of both feet 02/14/2014  . Metatarsal deformity 02/14/2014  . Acquired bilateral pes cavus 02/14/2014    Percival Spanish, PT, MPT 06/06/2019, 4:06 PM  [facility] [address]  [place], [place], [address] Phone: [phone]   Fax:   [phone]  Name: Connie Zavala MRN: [id number] Date of Birth: 06-25-1965

## 2019-06-07 ENCOUNTER — Ambulatory Visit: Payer: Federal, State, Local not specified - PPO

## 2019-06-07 DIAGNOSIS — M25662 Stiffness of left knee, not elsewhere classified: Secondary | ICD-10-CM

## 2019-06-07 DIAGNOSIS — M25562 Pain in left knee: Secondary | ICD-10-CM

## 2019-06-07 DIAGNOSIS — R262 Difficulty in walking, not elsewhere classified: Secondary | ICD-10-CM

## 2019-06-07 DIAGNOSIS — M6281 Muscle weakness (generalized): Secondary | ICD-10-CM

## 2019-06-07 DIAGNOSIS — G8929 Other chronic pain: Secondary | ICD-10-CM

## 2019-06-07 DIAGNOSIS — R2689 Other abnormalities of gait and mobility: Secondary | ICD-10-CM

## 2019-06-07 NOTE — Therapy (Signed)
[facility] [address]  [place], [place], [address] Phone: [phone]   Fax:  [phone]  Physical Therapy Treatment  Patient Details  Name: Connie Zavala MRN: [id number] Date of Birth: Dec 15, 1965 Referring Provider (PT): Josiah Lobo, MD   Encounter Date: 06/07/2019  PT End of Session - 06/07/19 1112    Visit Number  17    Number of Visits  28    Date for PT Re-Evaluation  07/18/19    Authorization Type  Federal [employer] - VL: 65    Authorization - Number of Visits  50    PT Start Time  1104    PT Stop Time  1146    PT Time Calculation (min)  42 min    Activity Tolerance  Patient tolerated treatment well    Behavior During Therapy  WFL for tasks assessed/performed       Past Medical History:  Diagnosis Date  . Asthma   . Hypertension     Past Surgical History:  Procedure Laterality Date  . RFA procedure      There were no vitals filed for this visit.  Subjective Assessment - 06/07/19 1111    Subjective  Pt. reporting she may be intested in trying DN next week.    Pertinent History  L ACL repair - 11/07/18; fall with closed L tibial plateau fracture & ACL rupture - 08/28/18    Patient Stated Goals  "to get my knee back to where I can bend it fully and not limp anymore"    Currently in Pain?  Yes    Pain Score  3     Pain Location  Knee    Pain Orientation  Left    Pain Descriptors / Indicators  Dull;Aching    Pain Type  Acute pain;Surgical pain    Pain Onset  More than a month ago    Multiple Pain Sites  No                       [facility] Adult PT Treatment/Exercise - 06/07/19 0001      Knee/Hip Exercises: Stretches   Sports administrator  Left;1 rep;60 seconds    Quad Stretch Limitations  prone with strap     Other Knee/Hip Stretches  supine "butterfly stretch" groin stretch x 30sec       Knee/Hip Exercises: Aerobic   Recumbent Bike  L2 x 6 min      Knee/Hip Exercises: Machines for  Strengthening   Cybex Knee Flexion  B LEs: 25# 2 x 10 reps       Knee/Hip Exercises: Standing   Functional Squat  15 reps;3 seconds    Functional Squat Limitations  sink      Knee/Hip Exercises: Supine   Bridges with Ball Squeeze  Both;15 reps;Strengthening   Adduction ball squeeze    Bridges with Clamshell  Both;15 reps;Strengthening   B hip isometric ER/abd into red TB    Straight Leg Raise with External Rotation  Left;15 reps;Strengthening    Straight Leg Raise with External Rotation Limitations  cues for quad set       Knee/Hip Exercises: Sidelying   Clams  L clam shell with red looped TB at knees x 15 reps       Manual Therapy   Manual Therapy  Myofascial release;Soft tissue mobilization    Manual therapy comments  supine     Soft tissue mobilization  STM/DTM  to L gracilis, L VMO, medial HS     Myofascial Release  TPR to L gracilis, L medial HS                PT Short Term Goals - 05/30/19 1321      PT SHORT TERM GOAL #1   Title  Patient will be independent with initial HEP    Status  Achieved   04/25/19     PT SHORT TERM GOAL #2   Title  Patient will increase L knee flexion to >/= 120 dg  to promote more normal gait and stair mechanics    Status  Achieved   04/26/19     PT SHORT TERM GOAL #3   Title  Patient will ambulate with normal gait pattern    Status  Achieved   05/30/19       PT Long Term Goals - 06/06/19 1321      PT LONG TERM GOAL #1   Title  Patient will be independent with ongoing/advanced HEP    Status  Partially Met   06/06/19 - met for current HEP   Target Date  07/18/19      PT LONG TERM GOAL #2   Title  Patient will increase L knee flexion to >/= 130 dg to allow for normal gait and stair mechanics    Status  On-going    Target Date  07/18/19      PT LONG TERM GOAL #3   Title  Patient will demonstrate improved L hip and knee strength to >/= 4+/5 for improved stability    Status  Partially Met    Target Date  07/18/19      PT LONG  TERM GOAL #4   Title  Patient will negotiate stairs reciprocally with normal step pattern w/o limitation due to L knee pain or weakness    Status  On-going   05/16/19:  ambulating with step-to pattern and patient notes this is due to L medial knees pain   Target Date  07/18/19      PT LONG TERM GOAL #5   Title  Patient to report ability to perform ADLs, household and 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  Focused initial MT on addressing ongoing tenderness/tightness on medial L thigh musculature than progressed to LE stretching and strengthening for remaining LE strength deficits.  Also focused on improving pt. comfort with descending 4" step leading with R LE as pt. still very apprehension with stairs descending with proper pattern.  Connie Zavala with intermittent complaint of 3/10 L medial knee pain which improved with rest.    Comorbidities  Metatarsalgia of both feet, B pes cavus & Equinus deformity of feet, HTN, asthma    Rehab Potential  Good    PT Treatment/Interventions  ADLs/Self Care Home Management;Cryotherapy;Electrical Stimulation;Iontophoresis 49m/ml Dexamethasone;Moist Heat;Gait training;Stair training;Functional mobility training;Therapeutic activities;Therapeutic exercise;Balance training;Neuromuscular re-education;Patient/family education;Manual techniques;Passive range of motion;Dry needling;Taping;Joint Manipulations    PT Next Visit Plan  L knee flexion ROM; LE strengthening    PT Home Exercise Plan  04/11/19 - patellar mobs, quad & ITB stretches, SLR; 04/25/19 - bridge/adduction, bridge/abduction into red  TB, sidelying clam shell (no resistance), quad set pillow squeeze supine;  05/10/19 -  TKE standing with blue band;  05/17/19 - 4-way hip kicker with yellow TB    Consulted and Agree with Plan of Care  Patient       Patient will benefit from skilled therapeutic intervention in order to improve the following deficits and impairments:  Abnormal gait, Decreased activity tolerance, Decreased balance, Decreased endurance, Decreased mobility, Decreased range of motion, Decreased scar mobility, Decreased strength, Difficulty walking, Increased fascial restricitons, Increased muscle spasms, Impaired perceived functional ability, Impaired flexibility, Pain  Visit Diagnosis: Chronic pain of left knee  Stiffness of left knee, not elsewhere classified  Muscle weakness (generalized)  Other abnormalities of gait and mobility  Difficulty in walking, not elsewhere classified     Problem List Patient Active Problem List   Diagnosis Date Noted  . Asthma with acute exacerbation 12/08/2017  . Seasonal allergic rhinitis due to pollen 07/06/2016  . Mild persistent asthma without complication 44/96/7591  . History of hypertension 07/06/2016  . Wheezing 03/19/2015  . Other allergic rhinitis 03/19/2015  . Equinus deformity of foot, acquired 02/18/2015  . Metatarsalgia of both feet 02/14/2014  . Metatarsal deformity 02/14/2014  . Acquired bilateral pes cavus 02/14/2014    Bess Harvest, PTA 06/07/19 12:00 PM   [facility] [address]  [place], [place], [address] Phone: [phone]   Fax:  [phone]  Name: Connie Zavala MRN: [id number] Date of Birth: 1966-03-02

## 2019-06-13 ENCOUNTER — Other Ambulatory Visit: Payer: Self-pay

## 2019-06-13 ENCOUNTER — Ambulatory Visit: Payer: Federal, State, Local not specified - PPO

## 2019-06-13 DIAGNOSIS — R262 Difficulty in walking, not elsewhere classified: Secondary | ICD-10-CM

## 2019-06-13 DIAGNOSIS — R2689 Other abnormalities of gait and mobility: Secondary | ICD-10-CM

## 2019-06-13 DIAGNOSIS — M25562 Pain in left knee: Secondary | ICD-10-CM

## 2019-06-13 DIAGNOSIS — M6281 Muscle weakness (generalized): Secondary | ICD-10-CM

## 2019-06-13 DIAGNOSIS — M25662 Stiffness of left knee, not elsewhere classified: Secondary | ICD-10-CM

## 2019-06-13 DIAGNOSIS — G8929 Other chronic pain: Secondary | ICD-10-CM

## 2019-06-13 NOTE — Therapy (Signed)
[facility] [address]  [place], [place], [address] Phone: [phone]   Fax:  [phone]  Physical Therapy Treatment  Patient Details  Name: Connie Zavala MRN: [id number] Date of Birth: 11-11-1965 Referring Provider (PT): Josiah Lobo, MD   Encounter Date: 06/13/2019  PT End of Session - 06/13/19 0858    Visit Number  18    Number of Visits  28    Date for PT Re-Evaluation  07/18/19    Authorization Type  Federal [employer] - VL: 55    Authorization - Number of Visits  50    PT Start Time  0846    PT Stop Time  0935    PT Time Calculation (min)  49 min    Activity Tolerance  Patient tolerated treatment well    Behavior During Therapy  [facility] for tasks assessed/performed       Past Medical History:  Diagnosis Date  . Asthma   . Hypertension     Past Surgical History:  Procedure Laterality Date  . RFA procedure      There were no vitals filed for this visit.  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    Pertinent History  L ACL repair - 11/07/18; fall with closed L tibial plateau fracture & ACL rupture - 08/28/18    Patient Stated Goals  "to get my knee back to where I can bend it fully and not limp anymore"    Currently in Pain?  Yes    Pain Score  5     Pain Location  Knee    Pain Orientation  Left    Pain Descriptors / Indicators  Dull;Aching    Pain Type  Acute pain;Surgical pain    Pain Onset  More than a month ago    Pain Frequency  Constant    Multiple Pain Sites  No                       [facility] Adult PT Treatment/Exercise - 06/13/19 0001      Knee/Hip Exercises: Stretches   Passive Hamstring Stretch  Left;2 reps;30 seconds    Passive Hamstring Stretch Limitations  supine with strap     Hip Flexor Stretch  Left;2 reps;30 seconds    Hip  Flexor Stretch Limitations  mod thomas position with strap       Knee/Hip Exercises: Aerobic   Recumbent Bike  L2 x 7 min      Knee/Hip Exercises: Machines for Strengthening   Cybex Knee Flexion  B con/L ecc: 20# x 10 resp       Knee/Hip Exercises: Standing   Terminal Knee Extension  Left;10 reps;Strengthening    Terminal Knee Extension Limitations  ball squeeze on wall 5" x 10 reps     Step Down  Left;10 reps;1 set;Step Height: 4";2 sets;Hand Hold: 1    Step Down Limitations  step up and down leading with R for L eccentric lowering; 2nd set x 3 with 6" step - pt. expressing apprehension     Functional Squat  15 reps;3 seconds    Functional Squat Limitations  TM rail tripple extension + quad set       Vasopneumatic   Number Minutes Vasopneumatic   10 minutes  Vasopnuematic Location   Knee   L knee    Vasopneumatic Pressure  Low    Vasopneumatic Temperature   34 dg               PT Short Term Goals - 05/30/19 1321      PT SHORT TERM GOAL #1   Title  Patient will be independent with initial HEP    Status  Achieved   04/25/19     PT SHORT TERM GOAL #2   Title  Patient will increase L knee flexion to >/= 120 dg  to promote more normal gait and stair mechanics    Status  Achieved   04/26/19     PT SHORT TERM GOAL #3   Title  Patient will ambulate with normal gait pattern    Status  Achieved   05/30/19       PT Long Term Goals - 06/06/19 1321      PT LONG TERM GOAL #1   Title  Patient will be independent with ongoing/advanced HEP    Status  Partially Met   06/06/19 - met for current HEP   Target Date  07/18/19      PT LONG TERM GOAL #2   Title  Patient will increase L knee flexion to >/= 130 dg to allow for normal gait and stair mechanics    Status  On-going    Target Date  07/18/19      PT LONG TERM GOAL #3   Title  Patient will demonstrate improved L hip and knee strength to >/= 4+/5 for improved stability    Status  Partially Met    Target Date  07/18/19       PT LONG TERM GOAL #4   Title  Patient will negotiate stairs reciprocally with normal step pattern w/o limitation due to L knee pain or weakness    Status  On-going   05/16/19:  ambulating with step-to pattern and patient notes this is due to L medial knees pain   Target Date  07/18/19      PT LONG TERM GOAL #5   Title  Patient to report ability to perform ADLs, household and 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  Therex focused on gentle TKE and LE strengthening and progressing pt. comfort level with L quad eccentric lowering on 4-6" steps for functional carryover.  Ended visit with pt. still with complaint of L knee pain thus applied ice-compression to L knee to reduce post-exercise pain and swelling.    Comorbidities  Metatarsalgia of both feet, B pes cavus & Equinus deformity of feet, HTN, asthma    Rehab Potential  Good    PT Treatment/Interventions  ADLs/Self Care Home Management;Cryotherapy;Electrical Stimulation;Iontophoresis 46m/ml Dexamethasone;Moist Heat;Gait training;Stair training;Functional mobility training;Therapeutic activities;Therapeutic exercise;Balance training;Neuromuscular re-education;Patient/family education;Manual techniques;Passive range of motion;Dry needling;Taping;Joint Manipulations    PT Next Visit Plan  L knee flexion ROM; LE strengthening    PT Home Exercise Plan  04/11/19 - patellar mobs, quad & ITB stretches, SLR; 04/25/19 - bridge/adduction, bridge/abduction into red TB, sidelying  clam shell (no resistance), quad set pillow squeeze supine;  05/10/19 - TKE standing with blue band;  05/17/19 - 4-way hip kicker  with yellow TB    Consulted and Agree with Plan of Care  Patient       Patient will benefit from skilled therapeutic intervention in order to improve the following deficits and impairments:  Abnormal gait, Decreased activity tolerance, Decreased balance, Decreased endurance, Decreased mobility, Decreased range of motion, Decreased scar mobility, Decreased strength, Difficulty walking, Increased fascial restricitons, Increased muscle spasms, Impaired perceived functional ability, Impaired flexibility, Pain  Visit Diagnosis: Chronic pain of left knee  Stiffness of left knee, not elsewhere classified  Muscle weakness (generalized)  Other abnormalities of gait and mobility  Difficulty in walking, not elsewhere classified     Problem List Patient Active Problem List   Diagnosis Date Noted  . Asthma with acute exacerbation 12/08/2017  . Seasonal allergic rhinitis due to pollen 07/06/2016  . Mild persistent asthma without complication 80/99/8338  . History of hypertension 07/06/2016  . Wheezing 03/19/2015  . Other allergic rhinitis 03/19/2015  . Equinus deformity of foot, acquired 02/18/2015  . Metatarsalgia of both feet 02/14/2014  . Metatarsal deformity 02/14/2014  . Acquired bilateral pes cavus 02/14/2014    Bess Harvest, PTA 06/13/19 12:29 PM   [facility] [address]  [place], [place], [address] Phone: [phone]   Fax:  [phone]  Name: Connie Zavala MRN: [id number] Date of Birth: [birth year]

## 2019-06-14 ENCOUNTER — Ambulatory Visit: Payer: Federal, State, Local not specified - PPO

## 2019-06-14 DIAGNOSIS — M25562 Pain in left knee: Secondary | ICD-10-CM | POA: Diagnosis not present

## 2019-06-14 DIAGNOSIS — M6281 Muscle weakness (generalized): Secondary | ICD-10-CM

## 2019-06-14 DIAGNOSIS — M25662 Stiffness of left knee, not elsewhere classified: Secondary | ICD-10-CM

## 2019-06-14 DIAGNOSIS — R2689 Other abnormalities of gait and mobility: Secondary | ICD-10-CM

## 2019-06-14 DIAGNOSIS — G8929 Other chronic pain: Secondary | ICD-10-CM

## 2019-06-14 DIAGNOSIS — R262 Difficulty in walking, not elsewhere classified: Secondary | ICD-10-CM

## 2019-06-14 NOTE — Therapy (Signed)
[facility] [address]  [place], [place], [address] Phone: [phone]   Fax:  [phone]  Physical Therapy Treatment  Patient Details  Name: Connie Zavala MRN: [id number] Date of Birth: 13-Oct-1965 Referring Provider (PT): Josiah Lobo, MD   Encounter Date: 06/14/2019  PT End of Session - 06/14/19 0810    Visit Number  19    Number of Visits  28    Date for PT Re-Evaluation  07/18/19    Authorization Type  Federal BCBS - VL: 23    Authorization - Number of Visits  50    PT Start Time  0800    PT Stop Time  0858    PT Time Calculation (min)  58 min    Activity Tolerance  Patient tolerated treatment well    Behavior During Therapy  [facility] for tasks assessed/performed       Past Medical History:  Diagnosis Date  . Asthma   . Hypertension     Past Surgical History:  Procedure Laterality Date  . RFA procedure      There were no vitals filed for this visit.  Subjective Assessment - 06/14/19 0805    Subjective  having some HS soreness after yard work yesterday.    Pertinent History  L ACL repair - 11/07/18; fall with closed L tibial plateau fracture & ACL rupture - 08/28/18    Patient Stated Goals  "to get my knee back to where I can bend it fully and not limp anymore"    Currently in Pain?  Yes    Pain Score  4     Pain Location  Knee    Pain Orientation  Left    Pain Descriptors / Indicators  Dull;Aching    Pain Type  Acute pain;Surgical pain    Pain Onset  More than a month ago    Pain Frequency  Constant    Multiple Pain Sites  Yes    Pain Score  8    Pain Location  --   "thigh"   Pain Orientation  Left;Posterior    Pain Descriptors / Indicators  Sore    Pain Type  Acute pain         [facility] PT Assessment - 06/14/19 0001      Observation/Other Assessments   Focus on Therapeutic Outcomes (FOTO)   60% (40% limitation)      AROM   AROM Assessment Site  Knee    Right/Left Knee  Left    Left  Knee Extension  -4    Left Knee Flexion  120   L medial knee pain reported at 8/10 at end range      Strength   Strength Assessment Site  Hip;Knee    Right/Left Hip  Right;Left    Right Hip Flexion  5/5    Right Hip Extension  5/5    Right Hip External Rotation   4+/5    Right Hip Internal Rotation  5/5    Right Hip ABduction  4/5    Right Hip ADduction  4/5    Left Hip Flexion  5/5    Left Hip Extension  4+/5    Left Hip External Rotation  4/5    Left Hip Internal Rotation  4+/5    Left Hip ABduction  4/5    Left Hip ADduction  4-/5    Right/Left Knee  Right;Left    Right Knee Flexion  5/5  Right Knee Extension  5/5    Left Knee Flexion  4/5    Left Knee Extension  5/5                   [facility] Adult PT Treatment/Exercise - 06/14/19 0001      Ambulation/Gait   Ambulation/Gait  Yes    Ambulation/Gait Assistance  5: Supervision    Ambulation/Gait Assistance Details  Cues for even step length and B even weight shift     Ambulation Distance (Feet)  100 Feet    Assistive device  None    Stairs  Yes    Stairs Assistance  5: Supervision    Stair Management Technique  One rail Right;Step to pattern;Sideways;Forwards    Number of Stairs  14    Height of Stairs  7    Gait Comments  Pt. navigating stairs ascending with heavy rails use and report of L knee pain with step-to pattern despite cueing to attempt step-through; descneding stairs with "sidways" pattern despite cueing to attempt step-to/step-through pattner      Knee/Hip Exercises: Stretches   Passive Hamstring Stretch  Left;2 reps;30 seconds    Passive Hamstring Stretch Limitations  supine with strap     Quad Stretch  Left;1 rep;30 seconds    Quad Stretch Limitations  prone with strap     ITB Stretch  Left;30 seconds;2 reps    ITB Stretch Limitations  supine with strap      Knee/Hip Exercises: Aerobic   Recumbent Bike  L2 x 6 min      Knee/Hip Exercises: Supine   Bridges with [employer]  Both;15  reps;Strengthening      Knee/Hip Exercises: Sidelying   Hip ABduction  Left   x 12 reps    Hip ABduction Limitations  cues for proper alignment     Hip ADduction  Left   x 12 reps     Vasopneumatic   Number Minutes Vasopneumatic   15 minutes    Vasopnuematic Location   Knee   L   Vasopneumatic Pressure  Low    Vasopneumatic Temperature   34 dg      Manual Therapy   Manual Therapy  Joint mobilization    Joint Mobilization  L patellar mobs all directions for improved ROM - moderate limitation medial/lateral                PT Short Term Goals - 05/30/19 1321      PT SHORT TERM GOAL #1   Title  Patient will be independent with initial HEP    Status  Achieved   04/25/19     PT SHORT TERM GOAL #2   Title  Patient will increase L knee flexion to >/= 120 dg  to promote more normal gait and stair mechanics    Status  Achieved   04/26/19     PT SHORT TERM GOAL #3   Title  Patient will ambulate with normal gait pattern    Status  Achieved   05/30/19       PT Long Term Goals - 06/14/19 0827      PT LONG TERM GOAL #1   Title  Patient will be independent with ongoing/advanced HEP    Status  Partially Met   06/06/19 - met for current HEP     PT LONG TERM GOAL #2   Title  Patient will increase L knee flexion to >/= 130 dg to allow for normal gait and stair mechanics  Status  On-going      PT LONG TERM GOAL #3   Title  Patient will demonstrate improved L hip and knee strength to >/= 4+/5 for improved stability    Status  Partially Met      PT LONG TERM GOAL #4   Title  Patient will negotiate stairs reciprocally with normal step pattern w/o limitation due to L knee pain or weakness    Status  On-going   06/14/19:  ambulating with step-to pattern ascending and reverting to "sideways" descending pattern despite therapist cueing for step-to/step-through pattern     PT LONG TERM GOAL #5   Title  Patient to report ability to perform ADLs, household and work-related tasks  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 reporting she sees MD for f/u this upcoming week.  Progress with PT somewhat limited by pt. hesitancy to participate in eccentric quad strengthening/LE strengthening and advised stair navigation "step through" pattern due to complaint of L medial knee pain.  Pt. still navigating stairs "sideways".  Pt. continues to work and frequently reports irritated L knee with increased pain/swelling which she attributes to getting on/off forklift and recently with yardwork.  Eccentric quad strengthening performed per pt. willingness for improved knee stability stepping down off forklift.  Pt. L knee AROM -3-120 dg with all LTGs either partially met or ongoing.  Ended visit with ice/compression to L knee to reduce swelling/pain which pt. attributes to yardwork and work-related tasks.    Comorbidities  Metatarsalgia of both feet, B pes cavus & Equinus deformity of feet, HTN, asthma    Rehab Potential  Good    PT Treatment/Interventions  ADLs/Self Care Home Management;Cryotherapy;Electrical Stimulation;Iontophoresis 24m/ml Dexamethasone;Moist Heat;Gait training;Stair training;Functional mobility training;Therapeutic activities;Therapeutic exercise;Balance training;Neuromuscular re-education;Patient/family education;Manual techniques;Passive range of motion;Dry needling;Taping;Joint Manipulations    PT Next Visit Plan  L knee flexion ROM; LE strengthening    PT Home Exercise Plan  04/11/19 - patellar mobs, quad & ITB stretches, SLR; 04/25/19 - bridge/adduction, bridge/abduction into red TB, sidelying clam shell (no resistance), quad set pillow squeeze supine;  05/10/19 - TKE standing with blue band;  05/17/19 - 4-way hip kicker with yellow TB    Consulted and Agree with Plan of Care  Patient        Patient will benefit from skilled therapeutic intervention in order to improve the following deficits and impairments:  Abnormal gait, Decreased activity tolerance, Decreased balance, Decreased endurance, Decreased mobility, Decreased range of motion, Decreased scar mobility, Decreased strength, Difficulty walking, Increased fascial restricitons, Increased muscle spasms, Impaired perceived functional ability, Impaired flexibility, Pain  Visit Diagnosis: Chronic pain of left knee  Stiffness of left knee, not elsewhere classified  Muscle weakness (generalized)  Other abnormalities of gait and mobility  Difficulty in walking, not elsewhere classified     Problem List Patient Active Problem List   Diagnosis Date Noted  . Asthma with acute exacerbation 12/08/2017  . Seasonal allergic rhinitis due to pollen 07/06/2016  . Mild persistent asthma without complication 085/92/9244 . History of hypertension 07/06/2016  . Wheezing 03/19/2015  . Other allergic rhinitis 03/19/2015  . Equinus deformity of foot, acquired 02/18/2015  . Metatarsalgia of both feet 02/14/2014  . Metatarsal deformity 02/14/2014  . Acquired bilateral pes cavus  02/14/2014    Bess Harvest, PTA 06/14/19 6:15 PM   [facility] [address]  [place], [place], [address] Phone: [phone]   Fax:  [phone]  Name: Connie Zavala MRN: [id number] Date of Birth: April 23, 1965

## 2019-06-19 ENCOUNTER — Ambulatory Visit (INDEPENDENT_AMBULATORY_CARE_PROVIDER_SITE_OTHER): Admitting: Orthopedic Surgery

## 2019-06-19 ENCOUNTER — Other Ambulatory Visit: Payer: Self-pay

## 2019-06-19 DIAGNOSIS — S83512D Sprain of anterior cruciate ligament of left knee, subsequent encounter: Secondary | ICD-10-CM | POA: Diagnosis not present

## 2019-06-20 ENCOUNTER — Ambulatory Visit: Payer: Federal, State, Local not specified - PPO

## 2019-06-20 ENCOUNTER — Ambulatory Visit: Payer: Federal, State, Local not specified - PPO | Attending: Internal Medicine

## 2019-06-20 DIAGNOSIS — Z23 Encounter for immunization: Secondary | ICD-10-CM

## 2019-06-20 NOTE — Progress Notes (Signed)
   Covid-19 Vaccination Clinic  Name:  ZELIA YZAGUIRRE    MRN: [id number] DOB: [birth year]  06/20/2019  Ms. Lenig was observed post Covid-19 immunization for 15 minutes without incident. She was provided with Vaccine Information Sheet and instruction to access the V-Safe system.   Ms. Gouin was instructed to call 911 with any severe reactions post vaccine: Marland Kitchen Difficulty breathing  . Swelling of face and throat  . A fast heartbeat  . A bad rash all over body  . Dizziness and weakness   Immunizations Administered    Name Date Dose VIS Date Route   Pfizer COVID-19 Vaccine 06/20/2019  8:35 AM 0.3 mL 02/24/2019 Intramuscular   Manufacturer: [employer], [employer]   Lot: [id number]   NDC: [address]-6

## 2019-06-21 ENCOUNTER — Ambulatory Visit: Payer: Federal, State, Local not specified - PPO | Admitting: Physical Therapy

## 2019-06-23 ENCOUNTER — Encounter: Payer: Self-pay | Admitting: Orthopedic Surgery

## 2019-06-23 NOTE — Progress Notes (Signed)
Office Visit Note   Patient: Connie Zavala           Date of Birth: 03-12-1966           MRN: [id number] Visit Date: 06/19/2019 Requested by: No referring provider defined for this encounter. PCP: System, Pcp Not In  Subjective: Chief Complaint  Patient presents with  . Left Knee - Follow-up    HPI: Connie Zavala is a patient who underwent left knee ACL reconstruction 11/03/2018.  She reports some continued pain and swelling.  She has been going to physical therapy doing the bike and quad strengthening exercises.  All in all she is making progress but does report decreased standing endurance.              ROS: All systems reviewed are negative as they relate to the chief complaint within the history of present illness.  Patient denies  fevers or chills.   Assessment & Plan: Visit Diagnoses:  1. Rupture of anterior cruciate ligament of left knee, subsequent encounter     Plan: Impression is stable knee with gradual return of strength.  She is about 8 months out.  No effusion today.  All in all she is making steady progress.  I think is her leg get stronger she will continue to improve.  Follow-up with me as needed.  Follow-Up Instructions: No follow-ups on file.   Orders:  No orders of the defined types were placed in this encounter.  No orders of the defined types were placed in this encounter.     Procedures: No procedures performed   Clinical Data: No additional findings.  Objective: Vital Signs: There were no vitals taken for this visit.  Physical Exam:   Constitutional: Patient appears well-developed HEENT:  Head: Normocephalic Eyes:EOM are normal Neck: Normal range of motion Cardiovascular: Normal rate Pulmonary/chest: Effort normal Neurologic: Patient is alert Skin: Skin is warm Psychiatric: Patient has normal mood and affect    Ortho Exam: Ortho exam demonstrates slightly antalgic gait to the left.  Graft is stable.  No effusion in the knee.  Range of  motion is full.  Has about 1/2 cm of quad atrophy left versus right.  No posterior lateral rotatory instability noted.  Specialty Comments:  No specialty comments available.  Imaging: No results found.   PMFS History: Patient Active Problem List   Diagnosis Date Noted  . Asthma with acute exacerbation 12/08/2017  . Seasonal allergic rhinitis due to pollen 07/06/2016  . Mild persistent asthma without complication 07/06/2016  . History of hypertension 07/06/2016  . Wheezing 03/19/2015  . Other allergic rhinitis 03/19/2015  . Equinus deformity of foot, acquired 02/18/2015  . Metatarsalgia of both feet 02/14/2014  . Metatarsal deformity 02/14/2014  . Acquired bilateral pes cavus 02/14/2014   Past Medical History:  Diagnosis Date  . Asthma   . Hypertension     No family history on file.  Past Surgical History:  Procedure Laterality Date  . RFA procedure     Social History   Occupational History  . Not on file  Tobacco Use  . Smoking status: Never Smoker  . Smokeless tobacco: Never Used  Substance and Sexual Activity  . Alcohol use: Yes    Alcohol/week: 0.0 standard drinks  . Drug use: No  . Sexual activity: Not on file    This is a narrative report per her request of the attorney for Jimmye Norman.  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  On 08/28/2018 when she was getting off a forklift she stepped on the metal part of a strap that she is to hold the mail in place.  This caused her to sustained a twisting injury to her left knee.  I saw her 3 days after that injury and aspirated about 70 cc of bloody fluid from the knee.  She did describe stepping off a forklift at work and injuring her knee.  Subsequent MRI scan did demonstrate ACL tear.  The following narrative report answers the questions requested.  #1 what is the medical condition that the claimant developed  and traumatic incident of 08/28/2018.  She sustained a minimal tibial plateau fracture and ACL tear.  Question #2 is my opinion that the incident of 08/28/2018 described by the claimant caused the problem.  The answer is yes.  She had an acute bloody effusion and a twisting mechanism of the injury matches the typical mechanism of injury for ACL tears.  Question #3 please describe the medical treatment and the course of medical care.  The patient underwent ACL reconstruction and has been doing rehabilitation since that time.  The ACL reconstruction is designed to stabilize the knee so that meniscal damage does not occur in the future.  Question #4 does the claimant have medical restrictions due to the diagnosis.  At this time she is improving but she does have limited shift in walking endurance due to diminished quad strength.  I think that will likely improve up to a year after injury.  For now we have restricted her duty to 8-hour shifts.

## 2019-06-26 NOTE — Progress Notes (Signed)
Faxed to [facility] [facility] [phone] [address]  [place] [address] Phone [phone]

## 2019-06-27 ENCOUNTER — Encounter: Payer: Self-pay | Admitting: Physical Therapy

## 2019-06-27 ENCOUNTER — Ambulatory Visit: Payer: Federal, State, Local not specified - PPO | Attending: Orthopedic Surgery | Admitting: Physical Therapy

## 2019-06-27 ENCOUNTER — Other Ambulatory Visit: Payer: Self-pay

## 2019-06-27 DIAGNOSIS — M6281 Muscle weakness (generalized): Secondary | ICD-10-CM | POA: Diagnosis present

## 2019-06-27 DIAGNOSIS — R2689 Other abnormalities of gait and mobility: Secondary | ICD-10-CM | POA: Insufficient documentation

## 2019-06-27 DIAGNOSIS — M25562 Pain in left knee: Secondary | ICD-10-CM | POA: Insufficient documentation

## 2019-06-27 DIAGNOSIS — G8929 Other chronic pain: Secondary | ICD-10-CM

## 2019-06-27 DIAGNOSIS — R262 Difficulty in walking, not elsewhere classified: Secondary | ICD-10-CM | POA: Diagnosis present

## 2019-06-27 DIAGNOSIS — M25662 Stiffness of left knee, not elsewhere classified: Secondary | ICD-10-CM | POA: Insufficient documentation

## 2019-06-27 NOTE — Therapy (Signed)
Connie Zavala [address]  [place], [place], [address] Phone: [phone]   Fax:  [phone]  Physical Therapy Treatment  Patient Details  Name: Connie Zavala MRN: [id number] Date of Birth: 11/13/1965 Referring Provider (PT): Josiah Lobo, MD   Encounter Date: 06/27/2019  PT End of Session - 06/27/19 1539    Visit Number  20    Number of Visits  28    Date for PT Re-Evaluation  07/18/19    Authorization Type  Federal BCBS - VL: 52    Authorization - Number of Visits  50    PT Start Time  1539    PT Stop Time  1619    PT Time Calculation (min)  40 min    Activity Tolerance  Patient tolerated treatment well    Behavior During Therapy  [facility] for tasks assessed/performed       Past Medical History:  Diagnosis Date  . Asthma   . Hypertension     Past Surgical History:  Procedure Laterality Date  . RFA procedure      There were no vitals filed for this visit.  Subjective Assessment - 06/27/19 1544    Subjective  Pt reports MD wants her working more onthe leg press. She is is still restriucted to 8 hr shifts at work but can work every day if she needs to. Further MD f/u will be as needed.    Pertinent History  L ACL repair - 11/07/18; fall with closed L tibial plateau fracture & ACL rupture - 08/28/18    Patient Stated Goals  "to get my knee back to where I can bend it fully and not limp anymore"    Currently in Pain?  Yes    Pain Score  4     Pain Location  Knee    Pain Orientation  Left    Pain Descriptors / Indicators  Dull;Aching    Pain Type  Surgical pain;Chronic pain    Pain Frequency  Constant         [facility] PT Assessment - 06/27/19 1539      Assessment   Medical Diagnosis  L knee pain s/p ACL reconstruction    Referring Provider (PT)  Josiah Lobo, MD    Onset Date/Surgical Date  11/07/18    Next MD Visit  PRN                   [facility] Adult PT Treatment/Exercise - 06/27/19  1539      Exercises   Exercises  Knee/Hip      Knee/Hip Exercises: Aerobic   Recumbent Bike  L2 x 6 min      Knee/Hip Exercises: Machines for Strengthening   Cybex Leg Press  R LE 25# x 10, L LE 15# x10, B LE 35# x 10      Knee/Hip Exercises: Standing   Forward Lunges  Left;Right;10 reps;3 seconds    Forward Lunges Limitations  cues for downward motion rather than fwd weight shift, avoiding knee fwd of toes; UE support on counter    Terminal Knee Extension  Left;15 reps;Strengthening;Theraband    Theraband Level (Terminal Knee Extension)  --   Black TB   Terminal Knee Extension Limitations  cues for quad activation and controlled release    Step Down  Left;10 reps;Step Height: 6";Hand Hold: 2;5 reps;Step Height: 4";Hand Hold: 1    Step Down Limitations  lateral (6") & forward (4")  eccentric heel touch down     Functional Squat  15 reps;3 seconds    Functional Squat Limitations  TRX triple extension    Other Standing Knee Exercises  B side stepping & fwd/back monster walk with looped red TB at ankles 2 x 30 ft               PT Short Term Goals - 05/30/19 1321      PT SHORT TERM GOAL #1   Title  Patient will be independent with initial HEP    Status  Achieved   04/25/19     PT SHORT TERM GOAL #2   Title  Patient will increase L knee flexion to >/= 120 dg  to promote more normal gait and stair mechanics    Status  Achieved   04/26/19     PT SHORT TERM GOAL #3   Title  Patient will ambulate with normal gait pattern    Status  Achieved   05/30/19       PT Long Term Goals - 06/14/19 0827      PT LONG TERM GOAL #1   Title  Patient will be independent with ongoing/advanced HEP    Status  Partially Met   06/06/19 - met for current HEP     PT LONG TERM GOAL #2   Title  Patient will increase L knee flexion to >/= 130 dg to allow for normal gait and stair mechanics    Status  On-going      PT LONG TERM GOAL #3   Title  Patient will demonstrate improved L hip and knee  strength to >/= 4+/5 for improved stability    Status  Partially Met      PT LONG TERM GOAL #4   Title  Patient will negotiate stairs reciprocally with normal step pattern w/o limitation due to L knee pain or weakness    Status  On-going   06/14/19:  ambulating with step-to pattern ascending and reverting to "sideways" descending pattern despite therapist cueing for step-to/step-through pattern     PT LONG TERM GOAL #5   Title  Patient to report ability to perform ADLs, household and work-related tasks 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 Limited tolerance with most exercises with greatest difficulty with lunges and eccentric step-downs due reports of increased L knee pain.    Comorbidities  Metatarsalgia of both feet, B pes cavus & Equinus deformity of feet, HTN, asthma    Rehab Potential  Good    PT Treatment/Interventions  ADLs/Self Care Home Management;Cryotherapy;Electrical Stimulation;Iontophoresis 68m/ml Dexamethasone;Moist Heat;Gait training;Stair training;Functional mobility training;Therapeutic activities;Therapeutic exercise;Balance training;Neuromuscular re-education;Patient/family education;Manual techniques;Passive range of motion;Dry needling;Taping;Joint Manipulations    PT Next Visit Plan  L knee flexion ROM; LE strengthening - emphasis on quad strengthening    PT Home Exercise Plan  04/11/19 - patellar mobs, quad & ITB stretches, SLR; 04/25/19 - bridge/adduction, bridge/abduction into red TB, sidelying clam shell (no resistance), quad set pillow squeeze supine;  05/10/19 - TKE standing with blue band;  05/17/19 - 4-way hip kicker  with yellow TB  Consulted and Agree with Plan of Care  Patient       Patient will benefit from skilled therapeutic intervention in order to improve the following deficits and impairments:  Abnormal gait, Decreased activity tolerance, Decreased balance, Decreased endurance, Decreased mobility, Decreased range of motion, Decreased scar mobility, Decreased strength, Difficulty walking, Increased fascial restricitons, Increased muscle spasms, Impaired perceived functional ability, Impaired flexibility, Pain  Visit Diagnosis: Chronic pain of left knee  Stiffness of left knee, not elsewhere classified  Muscle weakness (generalized)  Other abnormalities of gait and mobility  Difficulty in walking, not elsewhere classified     Problem List Patient Active Problem List   Diagnosis Date Noted  . Asthma with acute exacerbation 12/08/2017  . Seasonal allergic rhinitis due to pollen 07/06/2016  . Mild persistent asthma without complication 03/00/9233  . History of hypertension 07/06/2016  . Wheezing 03/19/2015  . Other allergic rhinitis 03/19/2015  . Equinus deformity of foot, acquired 02/18/2015  . Metatarsalgia of both feet 02/14/2014  . Metatarsal deformity 02/14/2014  . Acquired bilateral pes cavus 02/14/2014    Percival Spanish, PT, MPT 06/27/2019, 4:35 PM  [facility] [address]  [place], [place], [address] Phone: [phone]   Fax:  [phone]  Name: SHAMEIKA SPEELMAN MRN: [id number] Date of Birth: 01/18/1966

## 2019-06-28 ENCOUNTER — Encounter: Payer: Federal, State, Local not specified - PPO | Admitting: Physical Therapy

## 2019-06-28 ENCOUNTER — Ambulatory Visit: Payer: Federal, State, Local not specified - PPO | Admitting: Allergy and Immunology

## 2019-07-04 ENCOUNTER — Encounter: Payer: Self-pay | Admitting: Physical Therapy

## 2019-07-04 ENCOUNTER — Other Ambulatory Visit: Payer: Self-pay

## 2019-07-04 ENCOUNTER — Ambulatory Visit: Payer: Federal, State, Local not specified - PPO | Admitting: Physical Therapy

## 2019-07-04 DIAGNOSIS — M6281 Muscle weakness (generalized): Secondary | ICD-10-CM

## 2019-07-04 DIAGNOSIS — M25662 Stiffness of left knee, not elsewhere classified: Secondary | ICD-10-CM

## 2019-07-04 DIAGNOSIS — R2689 Other abnormalities of gait and mobility: Secondary | ICD-10-CM

## 2019-07-04 DIAGNOSIS — M25562 Pain in left knee: Secondary | ICD-10-CM

## 2019-07-04 DIAGNOSIS — R262 Difficulty in walking, not elsewhere classified: Secondary | ICD-10-CM

## 2019-07-04 DIAGNOSIS — G8929 Other chronic pain: Secondary | ICD-10-CM

## 2019-07-04 NOTE — Therapy (Signed)
[facility] [address]  [place], [place], [address] Phone: [phone]   Fax:  [phone]  Physical Therapy Treatment  Patient Details  Name: Connie Zavala MRN: [id number] Date of Birth: 11-07-1965 Referring Provider (PT): Josiah Lobo, MD   Encounter Date: 07/04/2019  PT End of Session - 07/04/19 1534    Visit Number  21    Number of Visits  28    Date for PT Re-Evaluation  07/18/19    Authorization Type  Federal [employer] - VL: [id number]    Authorization - Number of Visits  50    PT Start Time  1534    PT Stop Time  1630    PT Time Calculation (min)  56 min    Activity Tolerance  Patient tolerated treatment well    Behavior During Therapy  WFL for tasks assessed/performed       Past Medical History:  Diagnosis Date  . Asthma   . Hypertension     Past Surgical History:  Procedure Laterality Date  . RFA procedure      There were no vitals filed for this visit.  Subjective Assessment - 07/04/19 1537    Subjective  Pt with unenthusiastic "doing ok" today.    Pertinent History  L ACL repair - 11/07/18; fall with closed L tibial plateau fracture & ACL rupture - 08/28/18    Patient Stated Goals  "to get my knee back to where I can bend it fully and not limp anymore"    Currently in Pain?  Yes    Pain Score  5     Pain Location  Knee    Pain Orientation  Left    Pain Type  Surgical pain;Chronic pain    Pain Frequency  Constant                       [facility] Adult PT Treatment/Exercise - 07/04/19 1534      Knee/Hip Exercises: Aerobic   Recumbent Bike  L2 x 6 min      Knee/Hip Exercises: Machines for Strengthening   Cybex Leg Press  B LE 35# x 10, L LE 10# x15, B LE 35# x 10      Knee/Hip Exercises: Standing   Terminal Knee Extension  Left;15 reps;Strengthening;Theraband    Theraband Level (Terminal Knee Extension)  --   Black TB   Terminal Knee Extension Limitations  cues for quad activation  and controlled release    Forward Step Up  Left;15 reps;Step Height: 6";Hand Hold: 2    Forward Step Up Limitations  + black TB TKE    Wall Squat  10 reps;3 seconds;2 sets    Wall Squat Limitations  + adduction ball squeeze  x 1 set; + green TB hip ABD isometric x 1 set      Knee/Hip Exercises: Supine   Bridges with Ball Squeeze  Both;10 reps;Strengthening   + alt knee extension   Other Supine Knee/Hip Exercises  Straight leg bridge with heel on peanut ball 10 x 5 sec    Other Supine Knee/Hip Exercises  Straight leg bridge + alt SLR lift off penut ball x 10      Vasopneumatic   Number Minutes Vasopneumatic   10 minutes    Vasopnuematic Location   Knee   L   Vasopneumatic Pressure  Medium    Vasopneumatic Temperature   34 dg  PT Short Term Goals - 05/30/19 1321      PT SHORT TERM GOAL #1   Title  Patient will be independent with initial HEP    Status  Achieved   04/25/19     PT SHORT TERM GOAL #2   Title  Patient will increase L knee flexion to >/= 120 dg  to promote more normal gait and stair mechanics    Status  Achieved   04/26/19     PT SHORT TERM GOAL #3   Title  Patient will ambulate with normal gait pattern    Status  Achieved   05/30/19       PT Long Term Goals - 06/14/19 0827      PT LONG TERM GOAL #1   Title  Patient will be independent with ongoing/advanced HEP    Status  Partially Met   06/06/19 - met for current HEP     PT LONG TERM GOAL #2   Title  Patient will increase L knee flexion to >/= 130 dg to allow for normal gait and stair mechanics    Status  On-going      PT LONG TERM GOAL #3   Title  Patient will demonstrate improved L hip and knee strength to >/= 4+/5 for improved stability    Status  Partially Met      PT LONG TERM GOAL #4   Title  Patient will negotiate stairs reciprocally with normal step pattern w/o limitation due to L knee pain or weakness    Status  On-going   06/14/19:  ambulating with step-to pattern  ascending and reverting to "sideways" descending pattern despite therapist cueing for step-to/step-through pattern     PT LONG TERM GOAL #5   Title  Patient to report ability to perform ADLs, household and 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 Continued quad strengthening emphasis but tolerance for many exercises remains limited and she continues to require moderate cueing for good technique limiting options for safe additions to HEP.    Personal Factors and Comorbidities  Time since onset of injury/illness/exacerbation;Past/Current Experience;Comorbidity 3+    Comorbidities  Metatarsalgia of both feet, B pes cavus & Equinus deformity of feet, HTN, asthma    Examination-Activity Limitations  Bend;Lift;Locomotion Level;Sit;Stand;Squat;Transfers;Stairs    Rehab Potential  Good    PT Frequency  2x / week    PT Duration  6 weeks    PT Treatment/Interventions  ADLs/Self Care Home Management;Cryotherapy;Electrical Stimulation;Iontophoresis 36m/ml Dexamethasone;Moist Heat;Gait training;Stair training;Functional mobility training;Therapeutic activities;Therapeutic exercise;Balance training;Neuromuscular re-education;Patient/family education;Manual techniques;Passive range of motion;Dry needling;Taping;Joint Manipulations    PT Next Visit Plan  L knee flexion ROM; LE strengthening - emphasis on quad strengthening    PT Home Exercise Plan  04/11/19 - patellar mobs, quad & ITB stretches, SLR; 04/25/19 - bridge/adduction, bridge/abduction into red TB, sidelying clam shell (no resistance), quad set pillow squeeze supine;  05/10/19 - TKE standing with blue band;  05/17/19 - 4-way hip kicker with yellow TB    Consulted and Agree with Plan of Care   Patient       Patient will benefit from skilled therapeutic intervention in order to improve the following deficits and  impairments:  Abnormal gait, Decreased activity tolerance, Decreased balance, Decreased endurance, Decreased mobility, Decreased range of motion, Decreased scar mobility, Decreased strength, Difficulty walking, Increased fascial restricitons, Increased muscle spasms, Impaired perceived functional ability, Impaired flexibility, Pain  Visit Diagnosis: Chronic pain of left knee  Stiffness of left knee, not elsewhere classified  Muscle weakness (generalized)  Other abnormalities of gait and mobility  Difficulty in walking, not elsewhere classified     Problem List Patient Active Problem List   Diagnosis Date Noted  . Asthma with acute exacerbation 12/08/2017  . Seasonal allergic rhinitis due to pollen 07/06/2016  . Mild persistent asthma without complication 29/57/4734  . History of hypertension 07/06/2016  . Wheezing 03/19/2015  . Other allergic rhinitis 03/19/2015  . Equinus deformity of foot, acquired 02/18/2015  . Metatarsalgia of both feet 02/14/2014  . Metatarsal deformity 02/14/2014  . Acquired bilateral pes cavus 02/14/2014    Percival Spanish, PT, MPT 07/04/2019, 5:47 PM  [facility] [address]  [place], [place], [address] Phone: [phone]   Fax:  [phone]  Name: DORINA RIBAUDO MRN: [id number] Date of Birth: 02-May-1965

## 2019-07-05 ENCOUNTER — Ambulatory Visit: Payer: Federal, State, Local not specified - PPO

## 2019-07-05 DIAGNOSIS — M25562 Pain in left knee: Secondary | ICD-10-CM

## 2019-07-05 DIAGNOSIS — R262 Difficulty in walking, not elsewhere classified: Secondary | ICD-10-CM

## 2019-07-05 DIAGNOSIS — M6281 Muscle weakness (generalized): Secondary | ICD-10-CM

## 2019-07-05 DIAGNOSIS — G8929 Other chronic pain: Secondary | ICD-10-CM

## 2019-07-05 DIAGNOSIS — R2689 Other abnormalities of gait and mobility: Secondary | ICD-10-CM

## 2019-07-05 DIAGNOSIS — M25662 Stiffness of left knee, not elsewhere classified: Secondary | ICD-10-CM

## 2019-07-05 NOTE — Therapy (Signed)
[facility] [address]  [place], [place], [address] Phone: [phone]   Fax:  [phone]  Physical Therapy Treatment  Patient Details  Name: Connie Zavala MRN: [id number] Date of Birth: [birth year] Referring Provider (PT): Josiah Lobo, MD   Encounter Date: 07/05/2019  PT End of Session - 07/05/19 1538    Visit Number  22    Number of Visits  28    Date for PT Re-Evaluation  07/18/19    Authorization Type  Federal [employer] - VL: 67    Authorization - Number of Visits  50    PT Start Time  1532    PT Stop Time  1633    PT Time Calculation (min)  61 min    Activity Tolerance  Patient tolerated treatment well    Behavior During Therapy  [facility] for tasks assessed/performed       Past Medical History:  Diagnosis Date  . Asthma   . Hypertension     Past Surgical History:  Procedure Laterality Date  . RFA procedure      There were no vitals filed for this visit.  Subjective Assessment - 07/05/19 1606    Subjective  Pt. asking how many more therapy visits she has to come before she can be finished with therapy.    Pertinent History  L ACL repair - 11/07/18; fall with closed L tibial plateau fracture & ACL rupture - 08/28/18    Patient Stated Goals  "to get my knee back to where I can bend it fully and not limp anymore"    Currently in Pain?  Yes    Pain Score  5     Pain Location  Knee    Pain Orientation  Left    Pain Descriptors / Indicators  Dull;Aching    Pain Type  Surgical pain;Chronic pain    Pain Onset  More than a month ago    Pain Frequency  Constant    Multiple Pain Sites  No                       [facility] Adult PT Treatment/Exercise - 07/05/19 0001      Knee/Hip Exercises: Stretches   Passive Hamstring Stretch  Left;2 reps;30 seconds    Passive Hamstring Stretch Limitations  supine with strap     Hip Flexor Stretch  --    Hip Flexor Stretch Limitations  --    ITB Stretch   Left;30 seconds;2 reps    ITB Stretch Limitations  supine with strap    Piriformis Stretch  Left;30 seconds;2 reps    Piriformis Stretch Limitations  KTOS       Knee/Hip Exercises: Aerobic   Recumbent Bike  L2 x 6 min      Knee/Hip Exercises: Machines for Strengthening   Cybex Leg Press  B LE 35# x 15, L LE 20# 15; B LE 35# x 10 rpes       Knee/Hip Exercises: Standing   Lateral Step Up  Left;10 reps;Hand Hold: 2    Lateral Step Up Limitations  2 hand hold support on 7" step in stairwell     Forward Step Up  Left;Hand Hold: 1;10 reps   7" step in stairwell    Forward Step Up Limitations  + black TB TKE    Step Down  Left;10 reps;Step Height: 6";Hand Hold: 1    Step Down Limitations  lateral (6")  Knee/Hip Exercises: Supine   Bridges with Diona Foley Squeeze  Both;10 reps;Strengthening   Alternating LAQ at top of motion      Knee/Hip Exercises: Sidelying   Hip ADduction  Left;10 reps;Strengthening    Hip ADduction Limitations  1#      Vasopneumatic   Number Minutes Vasopneumatic   10 minutes    Vasopnuematic Location   Knee   L   Vasopneumatic Pressure  Medium    Vasopneumatic Temperature   34 dg             PT Education - 07/05/19 1625    Education Details  HEP update; eccentric lateral step-down (pt. going to perform sideways on her last step at home    Person(s) Educated  Patient    Methods  Explanation;Demonstration;Verbal cues;Handout    Comprehension  Verbalized understanding;Returned demonstration;Verbal cues required       PT Short Term Goals - 05/30/19 1321      PT SHORT TERM GOAL #1   Title  Patient will be independent with initial HEP    Status  Achieved   04/25/19     PT SHORT TERM GOAL #2   Title  Patient will increase L knee flexion to >/= 120 dg  to promote more normal gait and stair mechanics    Status  Achieved   04/26/19     PT SHORT TERM GOAL #3   Title  Patient will ambulate with normal gait pattern    Status  Achieved   05/30/19        PT Long Term Goals - 06/14/19 0827      PT LONG TERM GOAL #1   Title  Patient will be independent with ongoing/advanced HEP    Status  Partially Met   06/06/19 - met for current HEP     PT LONG TERM GOAL #2   Title  Patient will increase L knee flexion to >/= 130 dg to allow for normal gait and stair mechanics    Status  On-going      PT LONG TERM GOAL #3   Title  Patient will demonstrate improved L hip and knee strength to >/= 4+/5 for improved stability    Status  Partially Met      PT LONG TERM GOAL #4   Title  Patient will negotiate stairs reciprocally with normal step pattern w/o limitation due to L knee pain or weakness    Status  On-going   06/14/19:  ambulating with step-to pattern ascending and reverting to "sideways" descending pattern despite therapist cueing for step-to/step-through pattern     PT LONG TERM GOAL #5   Title  Patient to report ability to perform ADLs, household and 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 reporting some fatigue after yesterday's PT session.  Session today focused on quad/proximal hip strengthening with mild progression of leg press machine for LE strengthening and continued eccentric quad strengthening with TKE band-resisted step-downs.  Pt. giving good effort in session however does require very frequent cueing to isolate L quad with step-down training and TKE band resisted activities.  Pt. noting some knee pain to end session thus applied ice/compression to L knee to reduce post-exercise soreness and swelling.    Comorbidities  Metatarsalgia of both feet, B pes cavus & Equinus deformity of feet, HTN, asthma    Rehab Potential  Good    PT Treatment/Interventions  ADLs/Self Care Home Management;Cryotherapy;Electrical  Stimulation;Iontophoresis 62m/ml Dexamethasone;Moist Heat;Gait training;Stair training;Functional mobility training;Therapeutic activities;Therapeutic exercise;Balance training;Neuromuscular re-education;Patient/family education;Manual techniques;Passive range of motion;Dry needling;Taping;Joint Manipulations    PT Next Visit Plan  L knee flexion ROM; LE strengthening - emphasis on quad strengthening    PT Home Exercise Plan  04/11/19 - patellar mobs, quad & ITB stretches, SLR; 04/25/19 - bridge/adduction, bridge/abduction into red TB, sidelying clam shell (no resistance), quad set pillow squeeze supine;  05/10/19 - TKE standing with blue band;  05/17/19 - 4-way hip kicker with yellow TB    Consulted and Agree with Plan of Care  Patient       Patient will benefit from skilled therapeutic intervention in order to improve the following deficits and impairments:  Abnormal gait, Decreased activity tolerance, Decreased balance, Decreased endurance, Decreased mobility, Decreased range of motion, Decreased scar mobility, Decreased strength, Difficulty walking, Increased fascial restricitons, Increased muscle spasms, Impaired perceived functional ability, Impaired flexibility, Pain  Visit Diagnosis: Chronic pain of left knee  Stiffness of left knee, not elsewhere classified  Muscle weakness (generalized)  Other abnormalities of gait and mobility  Difficulty in walking, not elsewhere classified     Problem List Patient Active Problem List   Diagnosis Date Noted  . Asthma with acute exacerbation 12/08/2017  . Seasonal allergic rhinitis due to pollen 07/06/2016  . Mild persistent asthma without complication 034/05/5246 . History of hypertension 07/06/2016  . Wheezing 03/19/2015  . Other allergic rhinitis 03/19/2015  . Equinus deformity of foot, acquired 02/18/2015  . Metatarsalgia of both feet 02/14/2014  . Metatarsal deformity 02/14/2014  . Acquired bilateral pes cavus 02/14/2014    MBess Harvest PTA 07/05/19 4:40 PM   [facility] [address] [place] [address]Phone: [phone]  Fax:  [phone] Name: JAKIRRA LACERDAMRN: [id number] of Birth: [birth year]

## 2019-07-11 ENCOUNTER — Other Ambulatory Visit: Payer: Self-pay

## 2019-07-11 ENCOUNTER — Ambulatory Visit (INDEPENDENT_AMBULATORY_CARE_PROVIDER_SITE_OTHER): Admitting: Orthopedic Surgery

## 2019-07-11 ENCOUNTER — Ambulatory Visit: Payer: Federal, State, Local not specified - PPO

## 2019-07-11 ENCOUNTER — Encounter: Payer: Self-pay | Admitting: Orthopedic Surgery

## 2019-07-11 DIAGNOSIS — R262 Difficulty in walking, not elsewhere classified: Secondary | ICD-10-CM

## 2019-07-11 DIAGNOSIS — M65312 Trigger thumb, left thumb: Secondary | ICD-10-CM

## 2019-07-11 DIAGNOSIS — M6281 Muscle weakness (generalized): Secondary | ICD-10-CM

## 2019-07-11 DIAGNOSIS — M25662 Stiffness of left knee, not elsewhere classified: Secondary | ICD-10-CM

## 2019-07-11 DIAGNOSIS — R2689 Other abnormalities of gait and mobility: Secondary | ICD-10-CM

## 2019-07-11 DIAGNOSIS — G8929 Other chronic pain: Secondary | ICD-10-CM

## 2019-07-11 DIAGNOSIS — M25562 Pain in left knee: Secondary | ICD-10-CM | POA: Diagnosis not present

## 2019-07-11 NOTE — Therapy (Signed)
[facility] [address]  [place], [place], [address] Phone: [phone]   Fax:  [phone]  Physical Therapy Treatment  Patient Details  Name: Connie Zavala MRN: [id number] Date of Birth: [birth year] Referring Provider (PT): Josiah Lobo, MD   Encounter Date: 07/11/2019  PT End of Session - 07/11/19 1807    Visit Number  23    Number of Visits  28    Date for PT Re-Evaluation  07/18/19    Authorization Type  Federal [employer] - VL: 22    Authorization - Number of Visits  50    PT Start Time  5208    PT Stop Time  1618    PT Time Calculation (min)  36 min    Activity Tolerance  Patient tolerated treatment well    Behavior During Therapy  WFL for tasks assessed/performed       Past Medical History:  Diagnosis Date  . Asthma   . Hypertension     Past Surgical History:  Procedure Laterality Date  . RFA procedure      There were no vitals filed for this visit.  Subjective Assessment - 07/11/19 1545    Subjective  Pt presents 12 minutes late to session. She reports she had a lot of MD appts yesterday and saw Dr. Marlou Sa today for her trigger finger. She didn't discuss the knee. She still has pain in her knee, swelling, and difficulty walking "the way I should". Pt reports her L leg gives out on her at least 3x/week.    Pertinent History  L ACL repair - 11/07/18; fall with closed L tibial plateau fracture & ACL rupture - 08/28/18    Patient Stated Goals  "to get my knee back to where I can bend it fully and not limp anymore"    Currently in Pain?  Yes    Pain Score  4     Pain Location  Knee    Pain Orientation  Left    Pain Descriptors / Indicators  Aching;Dull    Pain Type  Chronic pain;Surgical pain         [facility] PT Assessment - 07/11/19 0001      AROM   Left Knee Extension  0   pain   Left Knee Flexion  106   pain - to 120 post MT                  [facility] Adult PT Treatment/Exercise -  07/11/19 0001      Knee/Hip Exercises: Stretches   Other Knee/Hip Stretches  Self knee stretch with anterior femur mob, R ankle over L to pull ankle back for knee flexion stretch (edu for home on bed)      Knee/Hip Exercises: Aerobic   Recumbent Bike  L3 x 6 min      Knee/Hip Exercises: Standing   Forward Step Up  Left;1 set;10 reps;Hand Hold: 2;Step Height: 4"    Forward Step Up Limitations  + green TB medial knee pull to recruit glute med   L glute med fatigue   Other Standing Knee Exercises  Lateral stepping with GTB crossed around ankles 3 laps with breaks at counter; VCs for pushing off contralateral heel    L glute med fatigue   Other Standing Knee Exercises  TKE with GTB 1 x 15 x 5" hold, max VCs/TCs to reduce compensation      Manual Therapy   Manual Therapy  Joint mobilization;Passive ROM    Manual therapy comments  pain with knee flexion, improved post manual therapy    Joint Mobilization  grade III-IV endrange knee flexion tib on fem mobs --> pivoted to anterior femur mobs with slow knee flexion from tibia with no reported pain and significant incr in motion    Passive ROM  Knee flexion/extension               PT Short Term Goals - 05/30/19 1321      PT SHORT TERM GOAL #1   Title  Patient will be independent with initial HEP    Status  Achieved   04/25/19     PT SHORT TERM GOAL #2   Title  Patient will increase L knee flexion to >/= 120 dg  to promote more normal gait and stair mechanics    Status  Achieved   04/26/19     PT SHORT TERM GOAL #3   Title  Patient will ambulate with normal gait pattern    Status  Achieved   05/30/19       PT Long Term Goals - 06/14/19 0827      PT LONG TERM GOAL #1   Title  Patient will be independent with ongoing/advanced HEP    Status  Partially Met   06/06/19 - met for current HEP     PT LONG TERM GOAL #2   Title  Patient will increase L knee flexion to >/= 130 dg to allow for normal gait and stair mechanics    Status   On-going      PT LONG TERM GOAL #3   Title  Patient will demonstrate improved L hip and knee strength to >/= 4+/5 for improved stability    Status  Partially Met      PT LONG TERM GOAL #4   Title  Patient will negotiate stairs reciprocally with normal step pattern w/o limitation due to L knee pain or weakness    Status  On-going   06/14/19:  ambulating with step-to pattern ascending and reverting to "sideways" descending pattern despite therapist cueing for step-to/step-through pattern     PT LONG TERM GOAL #5   Title  Patient to report ability to perform ADLs, household and 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 She continues to have high levels of medial knee pain over tibia predominantly. She had fair tolerance for manual therapy with end range knee flexion mobs, but c/o pain over tibia. No pain reported with anterior mob to femur in bent position with PROM knee flexion with knee flexion increasing from 106 to 120. Pt has significant glute med weakness/lack of endurance, likey contributing to medial knee pain. Session focused on quad recruitment and glute med activation with quick fatigue of both. Pt's gait continues to be poor: lacks L heel strike and DF on initial contact, as well as TKE and hip extension through push-off.    Comorbidities  Metatarsalgia of both feet, B pes cavus & Equinus deformity of feet, HTN, asthma    Rehab Potential  Good    PT Treatment/Interventions  ADLs/Self Care Home Management;Cryotherapy;Electrical Stimulation;Iontophoresis 31m/ml Dexamethasone;Moist Heat;Gait training;Stair training;Functional mobility training;Therapeutic activities;Therapeutic exercise;Balance training;Neuromuscular  re-education;Patient/family education;Manual techniques;Passive range of motion;Dry needling;Taping;Joint Manipulations    PT Next Visit Plan  L knee flexion ROM; LE strengthening - emphasis on quad and glute med strengthening    PT Home Exercise Plan  04/11/19 - patellar mobs, quad & ITB stretches, SLR; 04/25/19 - bridge/adduction, bridge/abduction into red TB, sidelying clam shell (no resistance), quad set pillow squeeze supine;  05/10/19 - TKE standing with blue band;  05/17/19 - 4-way hip kicker with yellow TB    Consulted and Agree with Plan of Care  Patient       Patient will benefit from skilled therapeutic intervention in order to improve the following deficits and impairments:  Abnormal gait, Decreased activity tolerance, Decreased balance, Decreased endurance, Decreased mobility, Decreased range of motion, Decreased scar mobility, Decreased strength, Difficulty walking, Increased fascial restricitons, Increased muscle spasms, Impaired perceived functional ability, Impaired flexibility, Pain  Visit Diagnosis: Chronic pain of left knee  Difficulty in walking, not elsewhere classified  Stiffness of left knee, not elsewhere classified  Muscle weakness (generalized)  Other abnormalities of gait and mobility     Problem List Patient Active Problem List   Diagnosis Date Noted  . Asthma with acute exacerbation 12/08/2017  . Seasonal allergic rhinitis due to pollen 07/06/2016  . Mild persistent asthma without complication 27/67/0110  . History of hypertension 07/06/2016  . Wheezing 03/19/2015  . Other allergic rhinitis 03/19/2015  . Equinus deformity of foot, acquired 02/18/2015  . Metatarsalgia of both feet 02/14/2014  . Metatarsal deformity 02/14/2014  . Acquired bilateral pes cavus 02/14/2014    Izell Lo[place]aceville, PT, DPT 07/11/2019, 6:13 PM  [facility] [address]  [place], [place], [address] Phone: [phone]    Fax:  [phone]  Name: KANASIA GAYMAN MRN: [id number] Date of Birth: [birth year]

## 2019-07-12 ENCOUNTER — Ambulatory Visit: Payer: Federal, State, Local not specified - PPO

## 2019-07-12 ENCOUNTER — Telehealth: Payer: Self-pay | Admitting: Orthopedic Surgery

## 2019-07-12 DIAGNOSIS — R262 Difficulty in walking, not elsewhere classified: Secondary | ICD-10-CM

## 2019-07-12 DIAGNOSIS — M6281 Muscle weakness (generalized): Secondary | ICD-10-CM

## 2019-07-12 DIAGNOSIS — G8929 Other chronic pain: Secondary | ICD-10-CM

## 2019-07-12 DIAGNOSIS — M25562 Pain in left knee: Secondary | ICD-10-CM | POA: Diagnosis not present

## 2019-07-12 DIAGNOSIS — M25662 Stiffness of left knee, not elsewhere classified: Secondary | ICD-10-CM

## 2019-07-12 DIAGNOSIS — R2689 Other abnormalities of gait and mobility: Secondary | ICD-10-CM

## 2019-07-12 NOTE — Telephone Encounter (Signed)
Patient called. She would like for Lauren to refax the narrative report to attorney Augustine Radar. Fax # [phone]

## 2019-07-12 NOTE — Therapy (Signed)
[facility] [address]  [place], [place], [address] Phone: [phone]   Fax:  [phone]  Physical Therapy Treatment  Patient Details  Name: Connie Zavala MRN: [id number] Date of Birth: 01-28-1966 Referring Provider (PT): Connie Lobo, MD   Encounter Date: 07/12/2019  PT End of Session - 07/12/19 1540    Visit Number  24    Number of Visits  28    Date for PT Re-Evaluation  07/18/19    Authorization Type  Federal [employer] - VL: 54    Authorization - Number of Visits  50    PT Start Time  1535    PT Stop Time  1615    PT Time Calculation (min)  40 min    Activity Tolerance  Patient tolerated treatment well    Behavior During Therapy  WFL for tasks assessed/performed       Past Medical History:  Diagnosis Date  . Asthma   . Hypertension     Past Surgical History:  Procedure Laterality Date  . RFA procedure      There were no vitals filed for this visit.  Subjective Assessment - 07/12/19 1539    Subjective  Pt. reporting MD has not allowed her yet to go back to working 12 hour shifts currently working 8 hour shifts.    Pertinent History  L ACL repair - 11/07/18; fall with closed L tibial plateau fracture & ACL rupture - 08/28/18    Patient Stated Goals  "to get my knee back to where I can bend it fully and not limp anymore"    Currently in Pain?  Yes    Pain Score  4     Pain Location  Knee    Pain Orientation  Left    Pain Descriptors / Indicators  Dull    Pain Type  Chronic pain;Surgical pain    Multiple Pain Sites  No         [facility] PT Assessment - 07/12/19 0001      AROM   Left Knee Extension  0    Left Knee Flexion  112   129 dg after mod thomas quad stretch with min strap assist                   [facility] Adult PT Treatment/Exercise - 07/12/19 0001      Knee/Hip Exercises: Stretches   Passive Hamstring Stretch  Left;2 reps;30 seconds    Passive Hamstring Stretch Limitations   supine with strap     Hip Flexor Stretch  Left;1 rep;30 seconds    ITB Stretch  Left;30 seconds;2 reps    ITB Stretch Limitations  supine with strap    Piriformis Stretch  Left;30 seconds;2 reps    Piriformis Stretch Limitations  Figure-4 with strap       Knee/Hip Exercises: Aerobic   Recumbent Bike  L3 x 6 min      Knee/Hip Exercises: Standing   Other Standing Knee Exercises  Side stepping, monster forward with red looped TB at ankles 2 x 30 sec       Knee/Hip Exercises: Supine   Bridges with Clamshell  Both;15 reps   B isometric hip abd/ER into green TB at knees      Knee/Hip Exercises: Sidelying   Clams  L clam shell with green x 10 reps    terminated after 8 reps due to fatigue and lack of technique  PT Short Term Goals - 05/30/19 1321      PT SHORT TERM GOAL #1   Title  Patient will be independent with initial HEP    Status  Achieved   04/25/19     PT SHORT TERM GOAL #2   Title  Patient will increase L knee flexion to >/= 120 dg  to promote more normal gait and stair mechanics    Status  Achieved   04/26/19     PT SHORT TERM GOAL #3   Title  Patient will ambulate with normal gait pattern    Status  Achieved   05/30/19       PT Long Term Goals - 06/14/19 0827      PT LONG TERM GOAL #1   Title  Patient will be independent with ongoing/advanced HEP    Status  Partially Met   06/06/19 - met for current HEP     PT LONG TERM GOAL #2   Title  Patient will increase L knee flexion to >/= 130 dg to allow for normal gait and stair mechanics    Status  On-going      PT LONG TERM GOAL #3   Title  Patient will demonstrate improved L hip and knee strength to >/= 4+/5 for improved stability    Status  Partially Met      PT LONG TERM GOAL #4   Title  Patient will negotiate stairs reciprocally with normal step pattern w/o limitation due to L knee pain or weakness    Status  On-going   06/14/19:  ambulating with step-to pattern ascending and reverting to  "sideways" descending pattern despite therapist cueing for step-to/step-through pattern     PT LONG TERM GOAL #5   Title  Patient to report ability to perform ADLs, household and 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  Pt. demonstrating L knee AROM (with complaint of end ROM flexion pain) 112 dg initially in session.  Demonstrated L quad tightness in mod Thomas stretch position despite reports of consistently performing this stretch at home.  Able to demo 129 dg L knee AROM flexion after quad stretching with pt. mildly pulling strap for assistance and without complaint of pain with pt. noting "that's all you're going to get".  Pt. expressing desire to finish with therapy after next visit in POC.  Notes she is still performing quad strengthening activities as home, mod Thomas quad stretch, 4-way hip kicker with red TB, and clam shell with red TB.  Ended visit with pt. deferring ice.    Comorbidities  Metatarsalgia of both feet, B pes cavus & Equinus deformity of feet, HTN, asthma    Rehab Potential  Good    PT Treatment/Interventions  ADLs/Self Care Home Management;Cryotherapy;Electrical Stimulation;Iontophoresis 47m/ml Dexamethasone;Moist Heat;Gait training;Stair training;Functional mobility training;Therapeutic activities;Therapeutic exercise;Balance training;Neuromuscular re-education;Patient/family education;Manual techniques;Passive range of motion;Dry needling;Taping;Joint Manipulations    PT Next Visit Plan  anticipated transition to home program per pt.    PT Home Exercise Plan  04/11/19 - patellar mobs, quad & ITB stretches,  SLR; 04/25/19 -  bridge/adduction, bridge/abduction into red TB, sidelying clam shell (no resistance), quad set pillow squeeze supine;  05/10/19 - TKE standing with blue band;  05/17/19 - 4-way hip kicker with yellow TB    Consulted and Agree with Plan of Care  Patient       Patient will benefit from skilled therapeutic intervention in order to improve the following deficits and impairments:  Abnormal gait, Decreased activity tolerance, Decreased balance, Decreased endurance, Decreased mobility, Decreased range of motion, Decreased scar mobility, Decreased strength, Difficulty walking, Increased fascial restricitons, Increased muscle spasms, Impaired perceived functional ability, Impaired flexibility, Pain  Visit Diagnosis: Chronic pain of left knee  Difficulty in walking, not elsewhere classified  Stiffness of left knee, not elsewhere classified  Muscle weakness (generalized)  Other abnormalities of gait and mobility     Problem List Patient Active Problem List   Diagnosis Date Noted  . Asthma with acute exacerbation 12/08/2017  . Seasonal allergic rhinitis due to pollen 07/06/2016  . Mild persistent asthma without complication 04/79/9872  . History of hypertension 07/06/2016  . Wheezing 03/19/2015  . Other allergic rhinitis 03/19/2015  . Equinus deformity of foot, acquired 02/18/2015  . Metatarsalgia of both feet 02/14/2014  . Metatarsal deformity 02/14/2014  . Acquired bilateral pes cavus 02/14/2014    Bess Harvest, PTA 07/12/19 4:42 PM   [facility] [address]  [place], [place], [address] Phone: [phone]   Fax:  [phone]  Name: Connie Zavala MRN: [id number] Date of Birth: 08-12-1965

## 2019-07-13 NOTE — Telephone Encounter (Signed)
Refaxed Tried calling patient to advise done. No answer  No VM to LM.

## 2019-07-14 ENCOUNTER — Encounter: Payer: Self-pay | Admitting: Orthopedic Surgery

## 2019-07-14 NOTE — Progress Notes (Signed)
Office Visit Note   Patient: Connie Zavala           Date of Birth: Oct 13, 1965           MRN: [id number] Visit Date: 07/11/2019 Requested by: No referring provider defined for this encounter. PCP: System, Pcp Not In  Subjective: Chief Complaint  Patient presents with  . Hand Pain    left thumb pain-sticking/locking    HPI: Connie Zavala is a 54 y.o. female who presents to the office complaining of left thumb pain.  Patient notes pain for the last month.  She denies any acute injury.  She has never had similar symptoms before.  She denies any history of surgery on the left thumb.  She describes pain in the base of the left thumb, on the palmar aspect.  She has symptoms of locking with thumb in a flexed position.  She denies any pain elsewhere throughout the hand.  She denies any numbness or tingling.  Denies any weakness of the hand.  She is right-hand dominant individual.                ROS:  All systems reviewed are negative as they relate to the chief complaint within the history of present illness.  Patient denies fevers or chills.  Assessment & Plan: Visit Diagnoses: No diagnosis found.  Plan: Patient is a 54 year old female who presents complaining of left thumb pain.  Pain has been present for 1 month and is associated with triggering of the left thumb.  She has never had similar symptoms before.  Impression is left trigger thumb.  She has tenderness over the A1 pulley of the left thumb as well as a palpable mass on the flexor tendon.  Triggering is observed in the office today.  Discussed options available to patient including topical anti-inflammatories versus injection versus surgical release.  Patient is early in the course of trigger thumb.  She will likely have good relief with topical anti-inflammatories.  Patient was provided with Pennsaid samples and if these are helpful she will purchase Voltaren over-the-counter.  If these do not relieve her pain and symptoms, she will  consider coming in for trigger thumb cortisone injection into the tendon sheath under ultrasound guidance..  Patient would like to avoid this.  She will follow-up with the office in 4 weeks for clinical recheck.  Follow-Up Instructions: No follow-ups on file.   Orders:  No orders of the defined types were placed in this encounter.  No orders of the defined types were placed in this encounter.     Procedures: No procedures performed   Clinical Data: No additional findings.  Objective: Vital Signs: There were no vitals taken for this visit.  Physical Exam:  Constitutional: Patient appears well-developed HEENT:  Head: Normocephalic Eyes:EOM are normal Neck: Normal range of motion Cardiovascular: Normal rate Pulmonary/chest: Effort normal Neurologic: Patient is alert Skin: Skin is warm Psychiatric: Patient has normal mood and affect  Ortho Exam:  Left hand exam Tenderness to palpation over the left thumb A1 pulley.  No significant tenderness throughout the rest of the left hand.  No pain with thumb circumduction.  No pain with left thumb adduction.  Palpable mass felt on the left thumb flexor tendon near the A1 pulley.  Triggering is observed as patient flexes and extends the left thumb.  Sensation intact through all dermatomes of the left hand.  No weakness of the finger adduction, finger abduction, EPL, wrist extension, thumb abduction.  Specialty  Comments:  No specialty comments available.  Imaging: No results found.   PMFS History: Patient Active Problem List   Diagnosis Date Noted  . Asthma with acute exacerbation 12/08/2017  . Seasonal allergic rhinitis due to pollen 07/06/2016  . Mild persistent asthma without complication 85/04/7739  . History of hypertension 07/06/2016  . Wheezing 03/19/2015  . Other allergic rhinitis 03/19/2015  . Equinus deformity of foot, acquired 02/18/2015  . Metatarsalgia of both feet 02/14/2014  . Metatarsal deformity 02/14/2014  .  Acquired bilateral pes cavus 02/14/2014   Past Medical History:  Diagnosis Date  . Asthma   . Hypertension     History reviewed. No pertinent family history.  Past Surgical History:  Procedure Laterality Date  . RFA procedure     Social History   Occupational History  . Not on file  Tobacco Use  . Smoking status: Never Smoker  . Smokeless tobacco: Never Used  Substance and Sexual Activity  . Alcohol use: Yes    Alcohol/week: 0.0 standard drinks  . Drug use: No  . Sexual activity: Not on file

## 2019-07-15 ENCOUNTER — Encounter: Payer: Self-pay | Admitting: Orthopedic Surgery

## 2019-07-18 ENCOUNTER — Ambulatory Visit: Payer: Federal, State, Local not specified - PPO | Admitting: Physical Therapy

## 2019-07-19 ENCOUNTER — Other Ambulatory Visit: Payer: Self-pay

## 2019-07-19 ENCOUNTER — Ambulatory Visit: Payer: Federal, State, Local not specified - PPO | Attending: Orthopedic Surgery

## 2019-07-19 DIAGNOSIS — M6281 Muscle weakness (generalized): Secondary | ICD-10-CM | POA: Insufficient documentation

## 2019-07-19 DIAGNOSIS — R262 Difficulty in walking, not elsewhere classified: Secondary | ICD-10-CM | POA: Diagnosis present

## 2019-07-19 DIAGNOSIS — G8929 Other chronic pain: Secondary | ICD-10-CM

## 2019-07-19 DIAGNOSIS — R2689 Other abnormalities of gait and mobility: Secondary | ICD-10-CM | POA: Insufficient documentation

## 2019-07-19 DIAGNOSIS — M25562 Pain in left knee: Secondary | ICD-10-CM | POA: Insufficient documentation

## 2019-07-19 DIAGNOSIS — M25662 Stiffness of left knee, not elsewhere classified: Secondary | ICD-10-CM | POA: Diagnosis present

## 2019-07-19 NOTE — Therapy (Signed)
[facility] [address]  [place], [place], [address] Phone: [phone]   Fax:  [phone]  Physical Therapy Treatment / Discharge Summary  Patient Details  Name: Connie Zavala MRN: [id number] Date of Birth: Mar 11, 1966 Referring Provider (PT): Josiah Lobo, MD   Encounter Date: 07/19/2019  PT End of Session - 07/19/19 1535    Visit Number  25    Number of Visits  28    Date for PT Re-Evaluation  07/18/19    Authorization Type  Federal [employer] - VL: 81    Authorization - Number of Visits  50    PT Start Time  1531    PT Stop Time  1613    PT Time Calculation (min)  42 min    Activity Tolerance  Patient tolerated treatment well    Behavior During Therapy  WFL for tasks assessed/performed       Past Medical History:  Diagnosis Date  . Asthma   . Hypertension     Past Surgical History:  Procedure Laterality Date  . RFA procedure      There were no vitals filed for this visit.      [facility] PT Assessment - 07/19/19 0001      AROM   Left Knee Extension  0    Left Knee Flexion  120      Strength   Strength Assessment Site  Hip;Knee    Right/Left Hip  Right;Left    Right Hip Flexion  4+/5    Right Hip Extension  5/5    Right Hip External Rotation   4+/5    Right Hip Internal Rotation  4+/5    Right Hip ABduction  4+/5    Right Hip ADduction  4+/5    Left Hip Flexion  5/5    Left Hip Extension  4+/5    Left Hip External Rotation  4+/5    Left Hip Internal Rotation  4+/5    Left Hip ABduction  4/5    Left Hip ADduction  4+/5    Right/Left Knee  Right;Left    Right Knee Flexion  5/5    Right Knee Extension  5/5    Left Knee Flexion  4+/5    Left Knee Extension  4+/5                   [facility] Adult PT Treatment/Exercise - 07/19/19 0001      Ambulation/Gait   Ambulation/Gait Assistance  5: Supervision    Ambulation/Gait Assistance Details  cues for even step-length and B heel strike      Ambulation Distance (Feet)  100 Feet    Assistive device  None    Gait Pattern  Step-through pattern;Decreased stance time - left    Stairs  Yes    Stairs Assistance  5: Supervision    Stairs Assistance Details (indicate cue type and reason)  able to ascend/descend stairs reciprocally with report of 5/10 medial L knee pain when descnending stairs with heavy rail use - very limited quad control     Stair Management Technique  One rail Right;Alternating pattern    Number of Stairs  14    Height of Stairs  7    Gait Comments  Pt. still ambualting with L antalgic gait pattern       Self-Care   Self-Care  Other Self-Care Comments    Other Self-Care Comments   Final HEP review to encourage  adherence       Knee/Hip Exercises: Aerobic   Recumbent Bike  L3 x 6 min               PT Short Term Goals - 05/30/19 1321      PT SHORT TERM GOAL #1   Title  Patient will be independent with initial HEP    Status  Achieved   04/25/19     PT SHORT TERM GOAL #2   Title  Patient will increase L knee flexion to >/= 120 dg  to promote more normal gait and stair mechanics    Status  Achieved   04/26/19     PT SHORT TERM GOAL #3   Title  Patient will ambulate with normal gait pattern    Status  Achieved   05/30/19       PT Long Term Goals - 07/19/19 1545      PT LONG TERM GOAL #1   Title  Patient will be independent with ongoing/advanced HEP    Status  Fully achieved       PT LONG TERM GOAL #2   Title  Patient will increase L knee flexion to >/= 130 dg to allow for normal gait and stair mechanics    Status  Not Met   07/19/19:  pt. able to demo 120dg AROM flexion     PT LONG TERM GOAL #3   Title  Patient will demonstrate improved L hip and knee strength to >/= 4+/5 for improved stability    Status  Partially Met   07/19/19; met with exception of L hip abduction 4/5     PT LONG TERM GOAL #4   Title  Patient will negotiate stairs reciprocally with normal step pattern w/o  limitation due to L knee pain or weakness    Status  Partially Met   07/19/19:  ambulating with step-through pattern ascending/descnending with heavy rail use and very limtied quad control     PT LONG TERM GOAL #5   Title  Patient to report ability to perform ADLs, household and 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  LTG #2 not met as pt. able to demo L knee AROM 0-120 dg (goal of 130 dg flexion) with complaint of L medial knee pain which she notes restricts her from increased flexion ROM.  Pt. with mild visible L knee swelling however at times in session does not carry herself in a manner consistent with high reported pain levels.  Pt. partially achieved LTG #3 as she met strength goal of at least 4+/5 L hip/knee with MMT with exception of L hip abduction strength 4/5.  Pt. proximal hip strength/LE strength has improved significantly since starting therapy however unclear if pt. consistently performing full HEP likely contributing to plateauing progress.  Pt. able to ascend/descend stairs reciprocally with heavy rail use today and limited L quad eccentric control and visible proximal hip weakness/hip drop.  LTG #4 partially achieved as pt. previously only  able to navigate stairs with step-to pattern and heavy rail use.  LTG #5 not met as pt. noting she is more recently limited with kneeling cleaning tasks at home and with ongoing work-related tasks such as getting up/down forklift by L medial knee pain.  Pt. wishing to make today last  session of therapy thus pt. now d/c with supervising PT approval.    Comorbidities  Metatarsalgia of both feet, B pes cavus & Equinus deformity of feet, HTN, asthma    Rehab Potential  Good    PT Treatment/Interventions  ADLs/Self Care Home Management;Cryotherapy;Electrical Stimulation;Iontophoresis 74m/ml Dexamethasone;Moist Heat;Gait training;Stair training;Functional mobility training;Therapeutic activities;Therapeutic exercise;Balance training;Neuromuscular re-education;Patient/family education;Manual techniques;Passive range of motion;Dry needling;Taping;Joint Manipulations    PT Next Visit Plan  d/c    PT Home Exercise Plan  04/11/19 - patellar mobs, quad & ITB stretches, SLR; 04/25/19 - bridge/adduction, bridge/abduction into red TB, sidelying clam shell (no resistance), quad set pillow squeeze supine;  05/10/19 - TKE standing with blue band;  05/17/19 - 4-way hip kicker with yellow TB    Consulted and Agree with Plan of Care  Patient       Patient will benefit from skilled therapeutic intervention in order to improve the following deficits and impairments:  Abnormal gait, Decreased activity tolerance, Decreased balance, Decreased endurance, Decreased mobility, Decreased range of motion, Decreased scar mobility, Decreased strength, Difficulty walking, Increased fascial restricitons, Increased muscle spasms, Impaired perceived functional ability, Impaired flexibility, Pain  Visit Diagnosis: Chronic pain of left knee  Difficulty in walking, not elsewhere classified  Stiffness of left knee, not elsewhere classified  Muscle weakness (generalized)  Other abnormalities of gait and mobility     Problem List Patient Active Problem List   Diagnosis Date Noted  . Asthma with acute exacerbation 12/08/2017  . Seasonal allergic rhinitis due to pollen 07/06/2016  . Mild persistent asthma without complication 071/16/5790 . History of hypertension 07/06/2016  . Wheezing 03/19/2015  . Other  allergic rhinitis 03/19/2015  . Equinus deformity of foot, acquired 02/18/2015  . Metatarsalgia of both feet 02/14/2014  . Metatarsal deformity 02/14/2014  . Acquired bilateral pes cavus 02/14/2014    MBess Harvest PTA 07/19/19 6:54 PM   [facility] [address] [place] [place] [address]Phone: [phone]  Fax:  [phone] Name: Connie MELDERMRN: [id number] of Birth: [birth year] PHYSICAL THERAPY DISCHARGE SUMMARY  Visits from Start of Care: 25  Current functional level related to goals / functional outcomes:   Refer to above clinical impression.   Remaining deficits:   As above. Pt continues to report near constant pain in L medial knee limiting ROM, strength and tolerance for many daily home and work activities.    Education / Equipment:   HEP  Plan: Patient agrees to discharge.  Patient goals were partially met. Patient is being discharged due to the patient's request.  ?????     JPercival Spanish PT, MPT 07/20/19, 12:37 PM  [facility] [place] [address]Phone: [phone]  Fax:  [phone]

## 2019-08-03 ENCOUNTER — Encounter (HOSPITAL_BASED_OUTPATIENT_CLINIC_OR_DEPARTMENT_OTHER): Payer: Self-pay | Admitting: *Deleted

## 2019-08-03 ENCOUNTER — Emergency Department (HOSPITAL_BASED_OUTPATIENT_CLINIC_OR_DEPARTMENT_OTHER)
Admission: EM | Admit: 2019-08-03 | Discharge: 2019-08-03 | Disposition: A | Discharge status: Home / Self Care | Payer: Federal, State, Local not specified - PPO | Attending: Emergency Medicine | Admitting: Emergency Medicine

## 2019-08-03 ENCOUNTER — Other Ambulatory Visit: Payer: Self-pay

## 2019-08-03 DIAGNOSIS — Z7984 Long term (current) use of oral hypoglycemic drugs: Secondary | ICD-10-CM | POA: Diagnosis not present

## 2019-08-03 DIAGNOSIS — R739 Hyperglycemia, unspecified: Secondary | ICD-10-CM

## 2019-08-03 DIAGNOSIS — J45909 Unspecified asthma, uncomplicated: Secondary | ICD-10-CM | POA: Diagnosis not present

## 2019-08-03 DIAGNOSIS — E1165 Type 2 diabetes mellitus with hyperglycemia: Secondary | ICD-10-CM | POA: Diagnosis not present

## 2019-08-03 DIAGNOSIS — Z79899 Other long term (current) drug therapy: Secondary | ICD-10-CM | POA: Diagnosis not present

## 2019-08-03 DIAGNOSIS — Z7982 Long term (current) use of aspirin: Secondary | ICD-10-CM | POA: Insufficient documentation

## 2019-08-03 DIAGNOSIS — K219 Gastro-esophageal reflux disease without esophagitis: Secondary | ICD-10-CM | POA: Diagnosis not present

## 2019-08-03 DIAGNOSIS — R109 Unspecified abdominal pain: Secondary | ICD-10-CM | POA: Diagnosis present

## 2019-08-03 DIAGNOSIS — E010 Iodine-deficiency related diffuse (endemic) goiter: Secondary | ICD-10-CM | POA: Diagnosis not present

## 2019-08-03 HISTORY — DX: Iodine-deficiency related diffuse (endemic) goiter: E01.0

## 2019-08-03 HISTORY — DX: Type 2 diabetes mellitus without complications: E11.9

## 2019-08-03 HISTORY — DX: Gastro-esophageal reflux disease without esophagitis: K21.9

## 2019-08-03 LAB — CBC WITH DIFFERENTIAL/PLATELET
Abs Immature Granulocytes: 0 10*3/uL (ref 0.00–0.07)
Basophils Absolute: 0 10*3/uL (ref 0.0–0.1)
Basophils Relative: 0 %
Eosinophils Absolute: 0.1 10*3/uL (ref 0.0–0.5)
Eosinophils Relative: 1 %
HCT: 38 % (ref 36.0–46.0)
Hemoglobin: 12.2 g/dL (ref 12.0–15.0)
Immature Granulocytes: 0 %
Lymphocytes Relative: 31 %
Lymphs Abs: 2.1 10*3/uL (ref 0.7–4.0)
MCH: 27.1 pg (ref 26.0–34.0)
MCHC: 32.1 g/dL (ref 30.0–36.0)
MCV: 84.3 fL (ref 80.0–100.0)
Monocytes Absolute: 1.1 10*3/uL — ABNORMAL HIGH (ref 0.1–1.0)
Monocytes Relative: 16 %
Neutro Abs: 3.5 10*3/uL (ref 1.7–7.7)
Neutrophils Relative %: 52 %
Platelets: 282 10*3/uL (ref 150–400)
RBC: 4.51 MIL/uL (ref 3.87–5.11)
RDW: 12.9 % (ref 11.5–15.5)
WBC: 6.8 10*3/uL (ref 4.0–10.5)
nRBC: 0 % (ref 0.0–0.2)

## 2019-08-03 LAB — COMPREHENSIVE METABOLIC PANEL
ALT: 23 U/L (ref 0–44)
AST: 17 U/L (ref 15–41)
Albumin: 3.4 g/dL — ABNORMAL LOW (ref 3.5–5.0)
Alkaline Phosphatase: 109 U/L (ref 38–126)
Anion gap: 9 (ref 5–15)
BUN: 24 mg/dL — ABNORMAL HIGH (ref 6–20)
CO2: 24 mmol/L (ref 22–32)
Calcium: 8.5 mg/dL — ABNORMAL LOW (ref 8.9–10.3)
Chloride: 96 mmol/L — ABNORMAL LOW (ref 98–111)
Creatinine, Ser: 0.61 mg/dL (ref 0.44–1.00)
GFR calc Af Amer: >60 mL/min (ref >60)
GFR calc non Af Amer: >60 mL/min (ref >60)
Glucose, Bld: 293 mg/dL — ABNORMAL HIGH (ref 70–99)
Potassium: 4 mmol/L (ref 3.5–5.1)
Sodium: 129 mmol/L — ABNORMAL LOW (ref 135–145)
Total Bilirubin: 0.7 mg/dL (ref 0.3–1.2)
Total Protein: 6.9 g/dL (ref 6.5–8.1)

## 2019-08-03 MED ORDER — METFORMIN HCL 500 MG PO TABS
500.0000 mg | ORAL_TABLET | Freq: Two times a day (BID) | ORAL | 0 refills | Status: AC
Start: 2019-08-03 — End: ?

## 2019-08-03 MED ORDER — OMEPRAZOLE 40 MG PO CPDR
DELAYED_RELEASE_CAPSULE | ORAL | 0 refills | Status: DC
Start: 2019-08-03 — End: 2020-04-04

## 2019-08-03 MED ORDER — ALUM & MAG HYDROXIDE-SIMETH 200-200-20 MG/5ML PO SUSP
ORAL | Status: AC
  Filled 2019-08-03: qty 30

## 2019-08-03 MED ORDER — LIDOCAINE VISCOUS HCL 2 % MT SOLN
15.0000 mL | Freq: Once | OROMUCOSAL | Status: AC
  Administered 2019-08-03: 15 mL via OROMUCOSAL

## 2019-08-03 MED ORDER — SUCRALFATE 1 G PO TABS: 1.0000 g | ORAL_TABLET | Freq: Three times a day (TID) | ORAL | 0 refills | Status: AC

## 2019-08-03 MED ORDER — LIDOCAINE VISCOUS HCL 2 % MT SOLN
OROMUCOSAL | Status: AC
  Filled 2019-08-03: qty 15

## 2019-08-03 MED ORDER — SUCRALFATE 1 GM/10ML PO SUSP
1.0000 g | Freq: Once | ORAL | Status: AC
  Administered 2019-08-03: 1 g via ORAL
  Filled 2019-08-03: qty 10

## 2019-08-03 MED ORDER — ALUM & MAG HYDROXIDE-SIMETH 200-200-20 MG/5ML PO SUSP
30.0000 mL | Freq: Once | ORAL | Status: AC
  Administered 2019-08-03: 30 mL via ORAL

## 2019-08-03 NOTE — ED Notes (Signed)
Vitals updated and pt given a warm blanket

## 2019-08-03 NOTE — ED Triage Notes (Addendum)
Pt c/o epigastric burning x 1 day HX GERD

## 2019-08-03 NOTE — ED Provider Notes (Addendum)
[facility] Provider Note: Connie Dell, MD, FACEP  CSN: [id number] MRN: [id number] ARRIVAL: 08/03/19 at 0006 ROOM: [facility]11/[facility]11   CHIEF COMPLAINT  Abdominal Pain   HISTORY OF PRESENT ILLNESS  08/03/19 4:23 AM Connie Zavala is a 54 y.o. female with a history of GERD.  She has had an exacerbation of her GERD symptoms for the past 2 weeks.  Specifically she is having epigastric pain and burning in her throat.  It is worse during the day and has not been relieved by over-the-counter omeprazole use.  She rates the pain as a 2 out of 10.  She was given a GI cocktail in triage without relief of her discomfort.  She has known thyroid disease which she states is being followed by a thyroid specialist.   Past Medical History:  Diagnosis Date  . Asthma   . Diabetes (HCC)   . GERD (gastroesophageal reflux disease)   . Hypertension   . Thyromegaly     Past Surgical History:  Procedure Laterality Date  . RFA procedure      No family history on file.  Social History   Tobacco Use  . Smoking status: Never Smoker  . Smokeless tobacco: Never Used  Substance Use Topics  . Alcohol use: Yes    Alcohol/week: 0.0 standard drinks  . Drug use: No    Prior to Admission medications   Medication Sig Start Date End Date Taking? Authorizing Provider  albuterol (PROVENTIL HFA) 108 (90 Base) MCG/ACT inhaler Inhale 2 puffs into the lungs every 4 (four) hours as needed for wheezing or shortness of breath. 12/08/17   Bobbitt, Heywood Iles, MD  aspirin EC 81 MG tablet Take 1 tablet (81 mg total) by mouth daily. 11/07/18 11/07/19  Magnant, Charles L, PA-C  cyanocobalamin (,VITAMIN B-12,) 1000 MCG/ML injection Inject into the muscle.    [provider]  estradiol (ESTRACE) 0.5 MG tablet  09/28/17   [provider]  loratadine (CLARITIN) 10 MG tablet Take by mouth. 10/27/17   [provider]  medroxyPROGESTERone (PROVERA) 2.5 MG tablet  09/28/17   [provider]  meloxicam (MOBIC) 15 MG tablet TAKE 1 TABLET BY MOUTH EVERY DAY 04/09/19   Magnant, Charles L, PA-C  metFORMIN (GLUCOPHAGE) 500 MG tablet Take 1 tablet (500 mg total) by mouth 2 (two) times daily with a meal. 08/03/19   Abyan Cadman, MD  montelukast (SINGULAIR) 10 MG tablet Take 1 tablet once at night for coughing or wheezing. 12/08/17   Bobbitt, Heywood Iles, MD  omeprazole (PRILOSEC) 40 MG capsule Take 1 capsule every morning at least 30 minutes before first dose of Carafate. 08/03/19   Gabrille Kilbride, MD  oxyCODONE (ROXICODONE) 5 MG immediate release tablet Take 1 tablet (5 mg total) by mouth every 6 (six) hours as needed. 11/07/18 11/07/19  Magnant, Charles L, PA-C  ramipril (ALTACE) 10 MG capsule  10/12/17   [provider]  sucralfate (CARAFATE) 1 g tablet Take 1 tablet (1 g total) by mouth 4 (four) times daily -  with meals and at bedtime. 08/03/19   Shailynn Fong, MD  triamterene-hydrochlorothiazide [facility]) 37.5-25 MG tablet  04/08/16   [provider]  fluticasone (FLONASE) 50 MCG/ACT nasal spray 2 sprays per nostril once a day if needed for stuffy nose. 12/08/17 08/03/19  Bobbitt, Heywood Iles, MD  fluticasone (FLOVENT HFA) 110 MCG/ACT inhaler Inhale 2 puffs into the lungs 2 (two) times daily. 12/08/17 08/03/19  Bobbitt, Heywood Iles, MD  Allergies Shellfish-derived products, Sulfa antibiotics, Sulfamethoxazole, Sumatriptan succinate, Dexamethasone sodium phosphate, and Mefenamic acid   REVIEW OF SYSTEMS  Negative except as noted here or in the History of Present Illness.   PHYSICAL EXAMINATION  Initial Vital Signs Blood pressure (!) 162/101, pulse 94, temperature 98.3 F (36.8 C), temperature source Oral, resp. rate 16, height 5\' 9"  (1.753 m), weight 90.7 kg, SpO2 99 %.  Examination General: Well-developed, well-nourished female in no acute distress; appearance consistent with age of record HENT: normocephalic; atraumatic; no pharyngeal erythema or exudate Eyes:  pupils equal, round and reactive to light; extraocular muscles intact Neck: supple; tender, enlarged thyroid Heart: regular rate and rhythm Lungs: clear to auscultation bilaterally Abdomen: soft; nondistended; mild epigastric tenderness; bowel sounds present Extremities: No deformity; full range of motion; pulses normal Neurologic: Awake, alert and oriented; motor function intact in all extremities and symmetric; no facial droop Skin: Warm and dry Psychiatric: Normal mood and affect   RESULTS  Summary of this visit's results, reviewed and interpreted by myself:   EKG Interpretation  Date/Time:  Thursday Aug 03 2019 00:15:45 EDT Ventricular Rate:  98 PR Interval:  158 QRS Duration: 86 QT Interval:  334 QTC Calculation: 426 R Axis:   71 Text Interpretation: Normal sinus rhythm Normal ECG No significant change was found Confirmed by Deysy Schabel, 10-16-1976 (Jonny Ruiz) on 08/03/2019 12:35:46 AM      Laboratory Studies: Results for orders placed or performed during the hospital encounter of 08/03/19 (from the past 24 hour(s))  CBC with Differential     Status: Abnormal   Collection Time: 08/03/19 12:28 AM  Result Value Ref Range   WBC 6.8 4.0 - 10.5 K/uL   RBC 4.51 3.87 - 5.11 MIL/uL   Hemoglobin 12.2 12.0 - 15.0 g/dL   HCT 08/05/19 45.8 - 09.9 %   MCV 84.3 80.0 - 100.0 fL   MCH 27.1 26.0 - 34.0 pg   MCHC 32.1 30.0 - 36.0 g/dL   RDW 83.3 82.5 - 05.3 %   Platelets 282 150 - 400 K/uL   nRBC 0.0 0.0 - 0.2 %   Neutrophils Relative % 52 %   Neutro Abs 3.5 1.7 - 7.7 K/uL   Lymphocytes Relative 31 %   Lymphs Abs 2.1 0.7 - 4.0 K/uL   Monocytes Relative 16 %   Monocytes Absolute 1.1 (H) 0.1 - 1.0 K/uL   Eosinophils Relative 1 %   Eosinophils Absolute 0.1 0.0 - 0.5 K/uL   Basophils Relative 0 %   Basophils Absolute 0.0 0.0 - 0.1 K/uL   Immature Granulocytes 0 %   Abs Immature Granulocytes 0.00 0.00 - 0.07 K/uL  Comprehensive metabolic panel     Status: Abnormal   Collection Time: 08/03/19 12:28  AM  Result Value Ref Range   Sodium 129 (L) 135 - 145 mmol/L   Potassium 4.0 3.5 - 5.1 mmol/L   Chloride 96 (L) 98 - 111 mmol/L   CO2 24 22 - 32 mmol/L   Glucose, Bld 293 (H) 70 - 99 mg/dL   BUN 24 (H) 6 - 20 mg/dL   Creatinine, Ser 08/05/19 0.44 - 1.00 mg/dL   Calcium 8.5 (L) 8.9 - 10.3 mg/dL   Total Protein 6.9 6.5 - 8.1 g/dL   Albumin 3.4 (L) 3.5 - 5.0 g/dL   AST 17 15 - 41 U/L   ALT 23 0 - 44 U/L   Alkaline Phosphatase 109 38 - 126 U/L   Total Bilirubin 0.7 0.3 - 1.2 mg/dL  GFR calc non Af Amer >60 >60 mL/min   GFR calc Af Amer >60 >60 mL/min   Anion gap 9 5 - 15   Imaging Studies: No results found.  ED COURSE and MDM  Nursing notes, initial and subsequent vitals signs, including pulse oximetry, reviewed and interpreted by myself.  Vitals:   08/03/19 0016 08/03/19 0018 08/03/19 0306  BP:  (!) 159/89 (!) 162/101  Pulse:  98 94  Resp:  18 16  Temp:  98.3 F (36.8 C)   TempSrc:  Oral   SpO2:  98% 99%  Weight: 90.7 kg    Height: 5\' 9"  (1.753 m)     Medications  alum & mag hydroxide-simeth (MAALOX/MYLANTA) 200-200-20 MG/5ML suspension (has no administration in time range)  lidocaine (XYLOCAINE) 2 % viscous mouth solution (has no administration in time range)  sucralfate (CARAFATE) 1 GM/10ML suspension 1 g (has no administration in time range)  alum & mag hydroxide-simeth (MAALOX/MYLANTA) 200-200-20 MG/5ML suspension 30 mL (30 mLs Oral Given 08/03/19 0023)  lidocaine (XYLOCAINE) 2 % viscous mouth solution 15 mL (15 mLs Mouth/Throat Given 08/03/19 0024)   We will place the patient on Nexium and Carafate for what is consistent with a GERD exacerbation.  Her lipase and liver enzymes are within normal limits.  She is noted to be hyperglycemic and we will start her on Metformin and have her follow-up with her PCP.  She has a history of diabetes.   PROCEDURES  Procedures   ED DIAGNOSES     ICD-10-CM   1. Gastroesophageal reflux disease, unspecified whether esophagitis  present  K21.9   2. Thyromegaly  E01.0   3. Hyperglycemia  R73.9        Ren Aspinall, Jenny Reichmann, MD 08/03/19 0435    Shanon Rosser, MD 08/03/19 [phone]

## 2019-08-07 ENCOUNTER — Ambulatory Visit: Payer: Federal, State, Local not specified - PPO | Admitting: Orthopedic Surgery

## 2019-08-21 ENCOUNTER — Telehealth: Payer: Self-pay

## 2019-08-21 NOTE — Telephone Encounter (Signed)
Connie Zavala, if will please call Connie Zavala at [facility] regarding the Narrative report. [place] will explain what Is required in order for Dr. August Saucer to get paid. Thanks! [phone],

## 2019-08-22 ENCOUNTER — Telehealth: Payer: Self-pay | Admitting: Orthopedic Surgery

## 2019-08-22 NOTE — Telephone Encounter (Signed)
[place] call from [employer] requesting a narrative report for patient. [place] asking for phone call. [place] number is [phone].

## 2019-08-22 NOTE — Telephone Encounter (Signed)
Tried calling. No answer. LMVM for someone to call me back to further discuss.

## 2019-08-22 NOTE — Telephone Encounter (Signed)
[facility] w/ [employer] states didn't receive records that [employer] processed 06/14/2019. I refaxed all records [phone]

## 2019-08-22 NOTE — Telephone Encounter (Signed)
Sending to you both  in case either of you get this call.

## 2019-08-22 NOTE — Telephone Encounter (Signed)
Connie Zavala talked with them. They are emailing necessary/requested information. Will review once received.

## 2019-10-24 ENCOUNTER — Ambulatory Visit: Payer: Federal, State, Local not specified - PPO | Admitting: Allergy and Immunology

## 2020-02-12 ENCOUNTER — Ambulatory Visit (INDEPENDENT_AMBULATORY_CARE_PROVIDER_SITE_OTHER): Admitting: Orthopedic Surgery

## 2020-02-12 DIAGNOSIS — S83512D Sprain of anterior cruciate ligament of left knee, subsequent encounter: Secondary | ICD-10-CM | POA: Diagnosis not present

## 2020-02-18 ENCOUNTER — Encounter: Payer: Self-pay | Admitting: Orthopedic Surgery

## 2020-02-18 NOTE — Progress Notes (Signed)
Office Visit Note   Patient: Connie Zavala           Date of Birth: 1966/01/09           MRN: [id number] Visit Date: 02/12/2020 Requested by: No referring provider defined for this encounter. PCP: Pcp, No  Subjective: Chief Complaint  Patient presents with  . Left Leg - Follow-up    HPI: Connie Zavala is a 54 year old patient who underwent left knee ACL reconstruction 11/07/2018.  Here for checkup.  Still pending approval from attorney for her case.  She works as a Museum/gallery exhibitions officer.  She is doing regular duty about 8 hours a day.  Still has some pain but no real instability with the left knee.              ROS: All systems reviewed are negative as they relate to the chief complaint within the history of present illness.  Patient denies  fevers or chills.   Assessment & Plan: Visit Diagnoses:  1. Rupture of anterior cruciate ligament of left knee, subsequent encounter     Plan: Impression is left knee ACL reconstruction.  Still has some residual symptoms and weakness and pain with that left knee.  Not unexpected.  Overall the knee is stable and functional.  Based on diagnosis related rating she is rated at 20% of the left knee.  This is permanent partial disability.  Follow-up with me as needed.  Follow-Up Instructions: Return if symptoms worsen or fail to improve.   Orders:  No orders of the defined types were placed in this encounter.  No orders of the defined types were placed in this encounter.     Procedures: No procedures performed   Clinical Data: No additional findings.  Objective: Vital Signs: There were no vitals taken for this visit.  Physical Exam:   Constitutional: Patient appears well-developed HEENT:  Head: Normocephalic Eyes:EOM are normal Neck: Normal range of motion Cardiovascular: Normal rate Pulmonary/chest: Effort normal Neurologic: Patient is alert Skin: Skin is warm Psychiatric: Patient has normal mood and affect    Ortho Exam: Ortho exam  demonstrates slightly antalgic gait to the left.  Lacks about 5 degrees of full extension but has flexion past 90.  Mild quad atrophy present left versus right around 1 to 2 cm.  ACL is stable to [place]achman and anterior drawer.  No posterior lateral rotatory instability is noted.  Mild patellofemoral crepitus present bilaterally.  Specialty Comments:  No specialty comments available.  Imaging: No results found.   PMFS History: Patient Active Problem List   Diagnosis Date Noted  . Asthma with acute exacerbation 12/08/2017  . Seasonal allergic rhinitis due to pollen 07/06/2016  . Mild persistent asthma without complication 07/06/2016  . History of hypertension 07/06/2016  . Wheezing 03/19/2015  . Other allergic rhinitis 03/19/2015  . Equinus deformity of foot, acquired 02/18/2015  . Metatarsalgia of both feet 02/14/2014  . Metatarsal deformity 02/14/2014  . Acquired bilateral pes cavus 02/14/2014   Past Medical History:  Diagnosis Date  . Asthma   . Diabetes (HCC)   . GERD (gastroesophageal reflux disease)   . Hypertension   . Thyromegaly     No family history on file.  Past Surgical History:  Procedure Laterality Date  . RFA procedure     Social History   Occupational History  . Not on file  Tobacco Use  . Smoking status: Never Smoker  . Smokeless tobacco: Never Used  Vaping Use  . Vaping Use: Never  used  Substance and Sexual Activity  . Alcohol use: Yes    Alcohol/week: 0.0 standard drinks  . Drug use: No  . Sexual activity: Not on file

## 2020-02-20 IMAGING — CR DG SHOULDER 2+V*L*
3 series · 3 of 3 positions shown · non-contrast
Comparison: None.

CLINICAL DATA: 52-year-old female with a history of shoulder injury

EXAM:
LEFT SHOULDER - 2+ VIEW

[shoulder grashey]
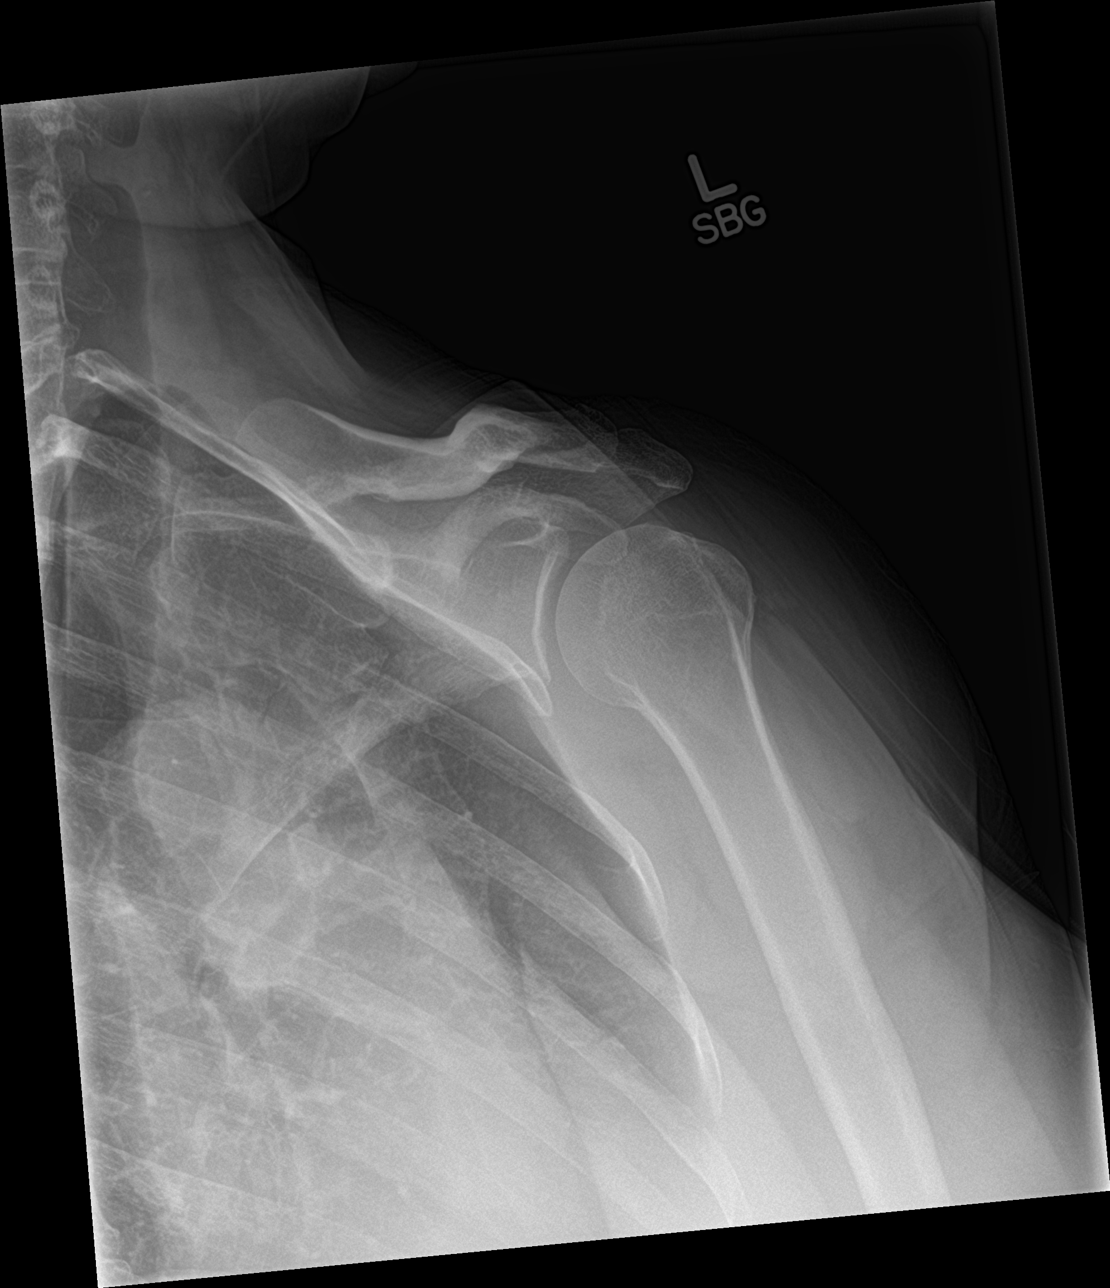

[shoulder y view]
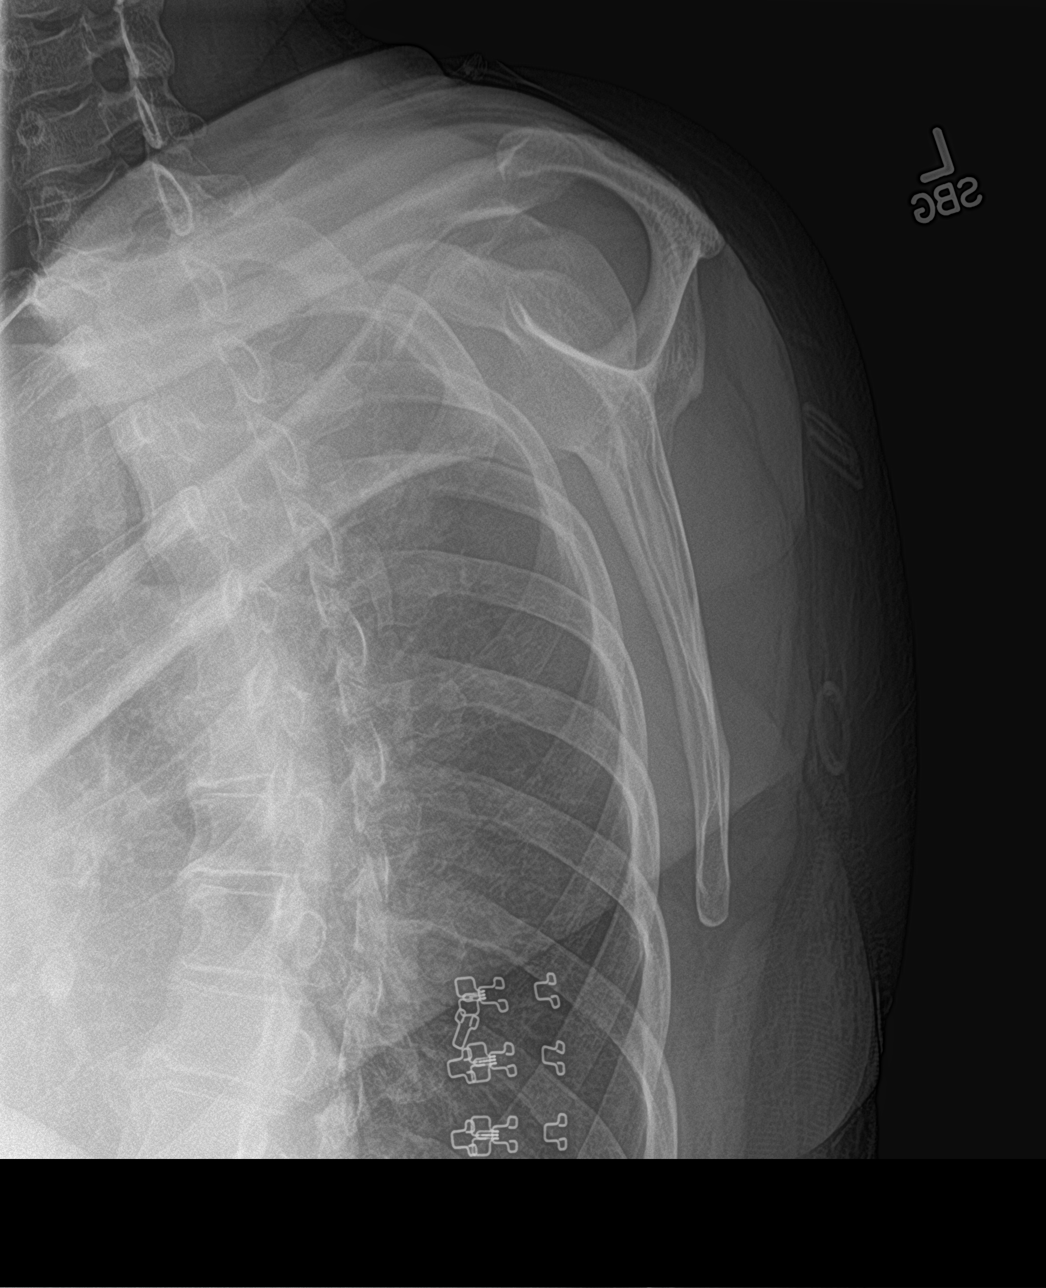

[shoulder axillary]
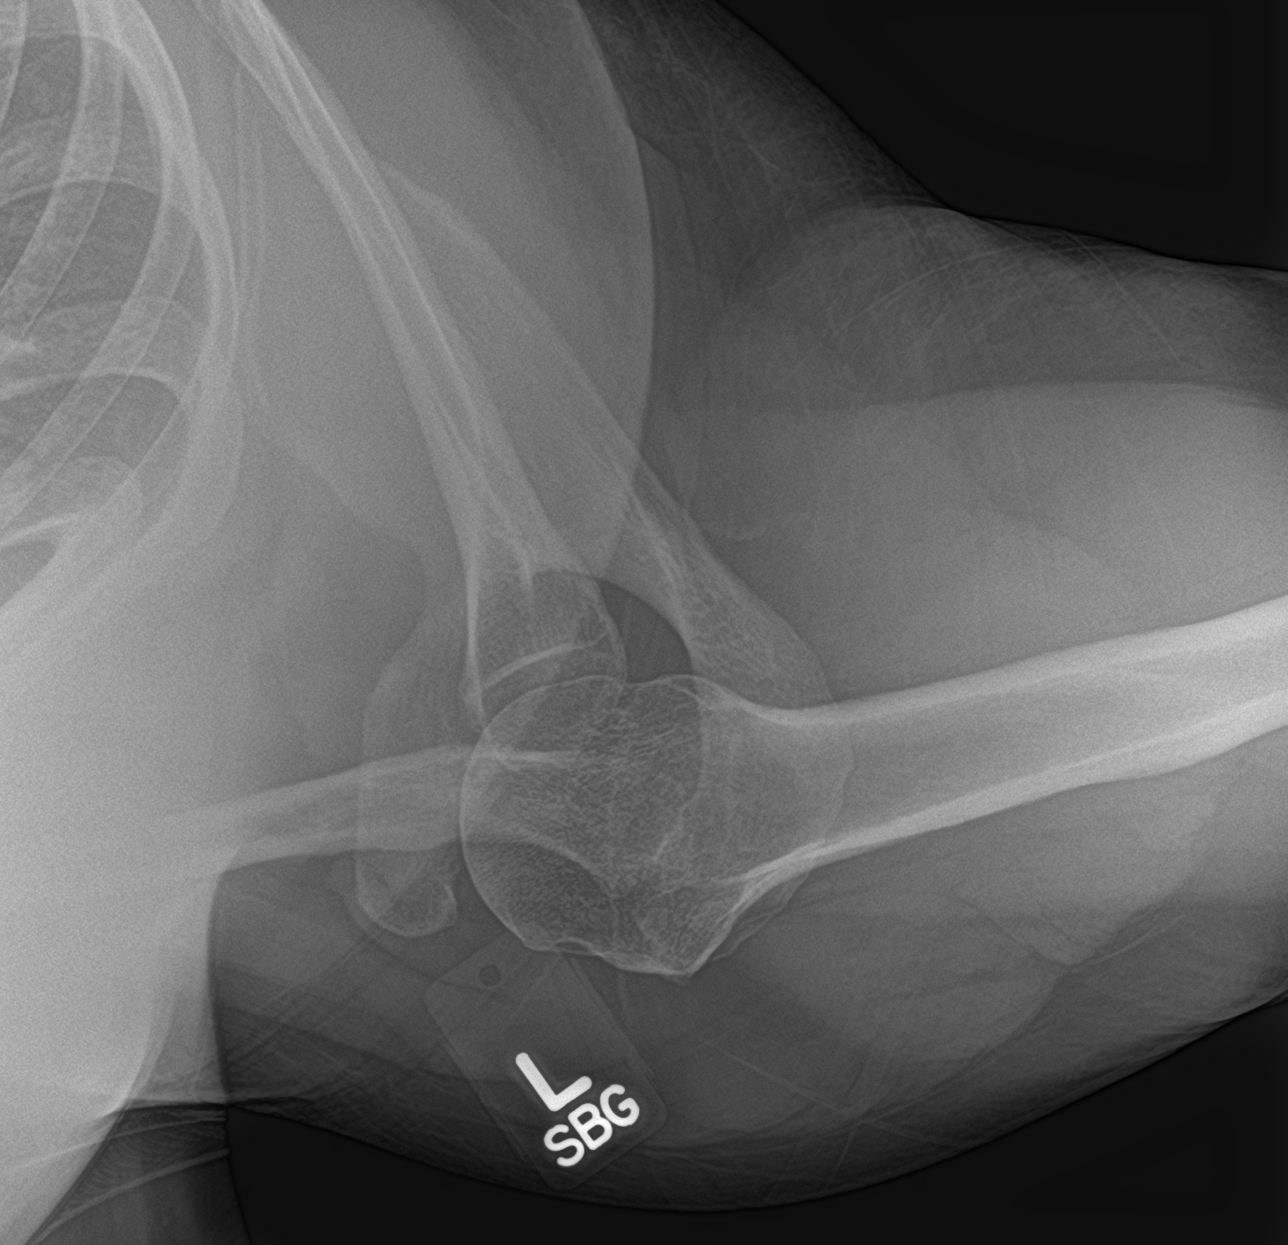

[3 of 3 positions shown; findings below may reference images not displayed]

FINDINGS: There is no evidence of fracture or dislocation. There is no
evidence of arthropathy or other focal bone abnormality. Soft
tissues are unremarkable.
IMPRESSION: Negative.

## 2020-04-01 ENCOUNTER — Ambulatory Visit: Payer: Federal, State, Local not specified - PPO | Admitting: Physician Assistant

## 2020-04-04 ENCOUNTER — Ambulatory Visit (INDEPENDENT_AMBULATORY_CARE_PROVIDER_SITE_OTHER): Admitting: Physician Assistant

## 2020-04-04 ENCOUNTER — Encounter: Payer: Self-pay | Admitting: Physician Assistant

## 2020-04-04 DIAGNOSIS — M7702 Medial epicondylitis, left elbow: Secondary | ICD-10-CM

## 2020-04-04 MED ORDER — LIDOCAINE HCL 1 % IJ SOLN
0.5000 mL | INTRAMUSCULAR | Status: AC | PRN
  Administered 2020-04-04: .5 mL

## 2020-04-04 MED ORDER — METHYLPREDNISOLONE ACETATE 40 MG/ML IJ SUSP
20.0000 mg | INTRAMUSCULAR | Status: AC | PRN
  Administered 2020-04-04: 20 mg

## 2020-04-04 NOTE — Progress Notes (Signed)
   Procedure Note  Patient: Connie Zavala             Date of Birth: [birth year]           MRN: [id number]             Visit Date: 04/04/2020  HPI: Ms. Hagg is well-known to Dr. Magnus Ivan service comes in today for the left elbow pain.  She has not been seen in quite some time at least a year and 1/2 to 2 years for the elbow pain.  She states last injection was very helpful.  She has had no new injury.  She is asking for repeat injection in her left elbow for medial epicondylitis.  Physical exam: General well-developed well-nourished female no acute distress. Left elbow no abnormal warmth erythema or effusion.  No skin breakdown.  She has tenderness over the medial epicondyle region.  Provocative maneuvers cause pain in the medial elbow.  Procedures: Visit Diagnoses:  1. Medial epicondylitis of left elbow     Hand/UE Inj for medial epicondylitis on 04/04/2020 5:08 PM Medications: 0.5 mL lidocaine 1 %; 20 mg methylPREDNISolone acetate 40 MG/ML    Plan: She will follow-up with [place] as needed.  Paperwork for the [employer] was filled out for the patient given to her today.  Questions were encouraged and answered.  She will continue her stretching exercises as shown in the past.                              A

## 2020-06-20 ENCOUNTER — Encounter: Payer: Self-pay | Admitting: Orthopedic Surgery

## 2020-06-20 ENCOUNTER — Ambulatory Visit (INDEPENDENT_AMBULATORY_CARE_PROVIDER_SITE_OTHER)

## 2020-06-20 ENCOUNTER — Other Ambulatory Visit: Payer: Self-pay

## 2020-06-20 ENCOUNTER — Ambulatory Visit (INDEPENDENT_AMBULATORY_CARE_PROVIDER_SITE_OTHER): Admitting: Orthopedic Surgery

## 2020-06-20 DIAGNOSIS — G8929 Other chronic pain: Secondary | ICD-10-CM | POA: Diagnosis not present

## 2020-06-20 DIAGNOSIS — M25562 Pain in left knee: Secondary | ICD-10-CM | POA: Diagnosis not present

## 2020-06-20 NOTE — Progress Notes (Signed)
Office Visit Note   Patient: Connie Zavala           Date of Birth: 03/03/1966           MRN: [id number] Visit Date: 06/20/2020 Requested by: No referring provider defined for this encounter. PCP: Pcp, No  Subjective: Chief Complaint  Patient presents with  . Left Knee - Pain    HPI: Connie Zavala is a 55 year old patient who underwent left knee ACL reconstruction with hamstring autograft 11/07/2018.  Having some anterior pain at times with prolonged standing.  She drives a forklift.  Cannot take anti-inflammatories for medical reasons.  Menisci intact at the time of surgery.  Her case has been accepted by [employer].  She would like to get the leg stronger.  No instability.  She does wear a knee sleeve.              ROS: All systems reviewed are negative as they relate to the chief complaint within the history of present illness.  Patient denies  fevers or chills.   Assessment & Plan: Visit Diagnoses:  1. Chronic pain of left knee     Plan: Impression is left knee pain with no effusion today.  Has about 1/2 cm of quad atrophy left versus right.  Graft is stable range of motion is excellent.  Plan is therapy here for strengthening 1-2 times a week for 4 to 6 weeks.  Forms are filled out today for medical leave which I anticipate would be 2 to 3/2 days/month maximum.  Follow-up in 6 weeks as needed for continued symptoms but at this time imaging not indicated due to absence of effusion and mechanical symptoms.  Could consider injection also as an adjuvant treatment but for now I would want to hold off on that.  Follow-Up Instructions: Return if symptoms worsen or fail to improve.   Orders:  Orders Placed This Encounter  Procedures  . XR KNEE 3 VIEW LEFT   No orders of the defined types were placed in this encounter.     Procedures: No procedures performed   Clinical Data: No additional findings.  Objective: Vital Signs: There were no vitals taken for this visit.  Physical  Exam:   Constitutional: Patient appears well-developed HEENT:  Head: Normocephalic Eyes:EOM are normal Neck: Normal range of motion Cardiovascular: Normal rate Pulmonary/chest: Effort normal Neurologic: Patient is alert Skin: Skin is warm Psychiatric: Patient has normal mood and affect    Ortho Exam: Ortho exam demonstrates left knee with excellent range of motion full extension to nearly full flexion.  No effusion.  Patella mobility excellent.  Graft stability also about 1 mm anterior drawer with solid endpoint.  No pressure on rotatory instability.  No patellofemoral crepitus.  Has about 1/2 cm atrophy left versus right.  Specialty Comments:  No specialty comments available.  Imaging: XR KNEE 3 VIEW LEFT  Result Date: 06/20/2020 AP lateral merchant radiographs left knee reviewed.  Hardware from prior ACL reconstruction in good position alignment with no complicating features.  Alignment intact.  No significant arthritis although there is mild medial joint space narrowing present.  No acute fracture.    PMFS History: Patient Active Problem List   Diagnosis Date Noted  . Asthma with acute exacerbation 12/08/2017  . Seasonal allergic rhinitis due to pollen 07/06/2016  . Mild persistent asthma without complication 07/06/2016  . History of hypertension 07/06/2016  . Wheezing 03/19/2015  . Other allergic rhinitis 03/19/2015  . Equinus deformity of foot, acquired  02/18/2015  . Metatarsalgia of both feet 02/14/2014  . Metatarsal deformity 02/14/2014  . Acquired bilateral pes cavus 02/14/2014   Past Medical History:  Diagnosis Date  . Asthma   . Diabetes (HCC)   . GERD (gastroesophageal reflux disease)   . Hypertension   . Thyromegaly     History reviewed. No pertinent family history.  Past Surgical History:  Procedure Laterality Date  . RFA procedure     Social History   Occupational History  . Not on file  Tobacco Use  . Smoking status: Never Smoker  . Smokeless  tobacco: Never Used  Vaping Use  . Vaping Use: Never used  Substance and Sexual Activity  . Alcohol use: Yes    Alcohol/week: 0.0 standard drinks  . Drug use: No  . Sexual activity: Not on file

## 2020-07-12 ENCOUNTER — Telehealth: Payer: Self-pay | Admitting: Orthopedic Surgery

## 2020-07-12 DIAGNOSIS — G8929 Other chronic pain: Secondary | ICD-10-CM

## 2020-07-12 NOTE — Telephone Encounter (Signed)
Patient called. She would like a referral for PT to come here.  °

## 2020-07-15 NOTE — Telephone Encounter (Signed)
thx

## 2020-07-15 NOTE — Telephone Encounter (Signed)
Patient requested PT. I have put in order for this.

## 2020-07-16 ENCOUNTER — Ambulatory Visit: Payer: Federal, State, Local not specified - PPO | Admitting: Physical Therapy

## 2020-07-23 ENCOUNTER — Ambulatory Visit: Payer: Federal, State, Local not specified - PPO | Admitting: Rehabilitative and Restorative Service Providers"

## 2020-07-26 ENCOUNTER — Ambulatory Visit: Payer: Federal, State, Local not specified - PPO | Admitting: Rehabilitative and Restorative Service Providers"

## 2020-08-03 ENCOUNTER — Other Ambulatory Visit: Payer: Self-pay

## 2020-08-03 ENCOUNTER — Emergency Department (INDEPENDENT_AMBULATORY_CARE_PROVIDER_SITE_OTHER)
Admission: EM | Admit: 2020-08-03 | Discharge: 2020-08-03 | Disposition: A | Discharge status: Home / Self Care | Payer: Federal, State, Local not specified - PPO | Source: Home / Self Care

## 2020-08-03 DIAGNOSIS — K529 Noninfective gastroenteritis and colitis, unspecified: Secondary | ICD-10-CM

## 2020-08-03 MED ORDER — DIPHENOXYLATE-ATROPINE 2.5-0.025 MG PO TABS
1.0000 | ORAL_TABLET | Freq: Four times a day (QID) | ORAL | 1 refills | Status: AC | PRN
Start: 2020-08-03 — End: 2021-08-03

## 2020-08-03 NOTE — ED Triage Notes (Signed)
Pt presents to Urgent Care with c/o diarrhea and nasal congestion x 5 days. Reports vomiting at onset of symptoms, but this has subsided. Has not checked temp. Pt is vaccinated against COVID and states she has a COVID test scheduled today at 1100.

## 2020-08-03 NOTE — ED Provider Notes (Signed)
[facility]    CSN: [id number] Arrival date & time: 08/03/20  0801      History   Chief Complaint Chief Complaint  Patient presents with  . Diarrhea  . Nasal Congestion    HPI Connie Zavala is a 55 y.o. female.   The history is provided by the patient. No language interpreter was used.  Diarrhea Quality:  Watery Severity:  Moderate Onset quality:  Sudden Number of episodes:  Mltiple Duration:  6 days Progression:  Worsening Relieved by:  Nothing Associated symptoms: URI   Associated symptoms: no abdominal pain and no recent cough   Risk factors: suspect food intake   Risk factors: no recent antibiotic use     Past Medical History:  Diagnosis Date  . Asthma   . Diabetes (HCC)   . GERD (gastroesophageal reflux disease)   . Hypertension   . Thyromegaly     Patient Active Problem List   Diagnosis Date Noted  . Asthma with acute exacerbation 12/08/2017  . Seasonal allergic rhinitis due to pollen 07/06/2016  . Mild persistent asthma without complication 07/06/2016  . History of hypertension 07/06/2016  . Wheezing 03/19/2015  . Other allergic rhinitis 03/19/2015  . Equinus deformity of foot, acquired 02/18/2015  . Metatarsalgia of both feet 02/14/2014  . Metatarsal deformity 02/14/2014  . Acquired bilateral pes cavus 02/14/2014    Past Surgical History:  Procedure Laterality Date  . RFA procedure      OB History   No obstetric history on file.      Home Medications    Prior to Admission medications   Medication Sig Start Date End Date Taking? Authorizing Provider  bismuth subsalicylate (PEPTO BISMOL) 262 MG/15ML suspension Take 30 mLs by mouth every 4 (four) hours as needed.   Yes [provider]  diphenoxylate-atropine (LOMOTIL) 2.5-0.025 MG tablet Take 1 tablet by mouth 4 (four) times daily as needed for diarrhea or loose stools. 08/03/20 08/03/21 Yes Cheron Schaumann K, PA-C  lisinopril (ZESTRIL) 40 MG tablet Take 40 mg by mouth  daily.   Yes [provider]  Loperamide HCl (IMODIUM PO) Take by mouth.   Yes [provider]  albuterol (PROVENTIL HFA) 108 (90 Base) MCG/ACT inhaler Inhale 2 puffs into the lungs every 4 (four) hours as needed for wheezing or shortness of breath. 12/08/17   Bobbitt, Heywood Iles, MD  cyanocobalamin (,VITAMIN B-12,) 1000 MCG/ML injection Inject into the muscle.    [provider]  estradiol (ESTRACE) 0.5 MG tablet  09/28/17   [provider]  loratadine (CLARITIN) 10 MG tablet Take by mouth. 10/27/17   [provider]  medroxyPROGESTERone (PROVERA) 2.5 MG tablet  09/28/17   [provider]  meloxicam (MOBIC) 15 MG tablet TAKE 1 TABLET BY MOUTH EVERY DAY 04/09/19   Magnant, Charles L, PA-C  metFORMIN (GLUCOPHAGE) 500 MG tablet Take 1 tablet (500 mg total) by mouth 2 (two) times daily with a meal. 08/03/19   Molpus, John, MD  methimazole (TAPAZOLE) 10 MG tablet Take by mouth. 06/18/20   [provider]  metoprolol succinate (TOPROL-XL) 50 MG 24 hr tablet Take 50 mg by mouth daily. 07/24/20   [provider]  montelukast (SINGULAIR) 10 MG tablet Take 1 tablet once at night for coughing or wheezing. 12/08/17   Bobbitt, Heywood Iles, MD  ramipril (ALTACE) 10 MG capsule  10/12/17   [provider]  sucralfate (CARAFATE) 1 g tablet Take 1 tablet (1 g total) by mouth 4 (  four) times daily -  with meals and at bedtime. 08/03/19   Molpus, John, MD  triamterene-hydrochlorothiazide [facility]) 37.5-25 MG tablet  04/08/16   [provider]  fluticasone (FLONASE) 50 MCG/ACT nasal spray 2 sprays per nostril once a day if needed for stuffy nose. 12/08/17 08/03/19  Bobbitt, Heywood Iles, MD  fluticasone (FLOVENT HFA) 110 MCG/ACT inhaler Inhale 2 puffs into the lungs 2 (two) times daily. 12/08/17 08/03/19  Bobbitt, Heywood Iles, MD    Family History Family History  Problem Relation Age of Onset  . Stroke Mother   . Heart attack Father      Social History Social History   Tobacco Use  . Smoking status: Never Smoker  . Smokeless tobacco: Never Used  Vaping Use  . Vaping Use: Never used  Substance Use Topics  . Alcohol use: Not Currently  . Drug use: No     Allergies   Shellfish-derived products, Sulfa antibiotics, Sulfamethoxazole, Sumatriptan succinate, Dexamethasone sodium phosphate, and Mefenamic acid   Review of Systems Review of Systems  Gastrointestinal: Positive for diarrhea. Negative for abdominal pain.  All other systems reviewed and are negative.    Physical Exam Triage Vital Signs ED Triage Vitals  Enc Vitals Group     BP 08/03/20 0827 121/79     Pulse Rate 08/03/20 0827 (!) 59     Resp 08/03/20 0827 20     Temp 08/03/20 0827 98.5 F (36.9 C)     Temp src --      SpO2 08/03/20 0827 99 %     Weight 08/03/20 0820 210 lb (95.3 kg)     Height 08/03/20 0820 5\' 9"  (1.753 m)     Head Circumference --      Peak Flow --      Pain Score 08/03/20 0820 0     Pain Loc --      Pain Edu? --      Excl. in GC? --    No data found.  Updated Vital Signs BP 121/79 (BP Location: Right Arm)   Pulse (!) 59   Temp 98.5 F (36.9 C)   Resp 20   Ht 5\' 9"  (1.753 m)   Wt 95.3 kg   SpO2 99%   BMI 31.01 kg/m   Visual Acuity Right Eye Distance:   Left Eye Distance:   Bilateral Distance:    Right Eye Near:   Left Eye Near:    Bilateral Near:     Physical Exam Vitals and nursing note reviewed.  Constitutional:      Appearance: She is well-developed.  HENT:     Head: Normocephalic.     Mouth/Throat:     Mouth: Mucous membranes are moist.  Cardiovascular:     Rate and Rhythm: Normal rate.     Pulses: Normal pulses.  Pulmonary:     Effort: Pulmonary effort is normal.  Abdominal:     General: There is no distension.  Musculoskeletal:        General: Normal range of motion.     Cervical back: Normal range of motion.  Skin:    General: Skin is warm.  Neurological:     General: No focal  deficit present.     Mental Status: She is alert and oriented to person, place, and time.  Psychiatric:        Mood and Affect: Mood normal.      UC Treatments / Results  Labs (all labs ordered are listed, but only abnormal results  are displayed) Labs Reviewed - No data to display  EKG   Radiology No results found.  Procedures Procedures (including critical care time)  Medications Ordered in UC Medications - No data to display  Initial Impression / Assessment and Plan / UC Course  I have reviewed the triage vital signs and the nursing notes.  Pertinent labs & imaging results that were available during my care of the patient were reviewed by me and considered in my medical decision making (see chart for details).    Pt has covid test to take today.  I suspect viral Gi illness  Final Clinical Impressions(s) / UC Diagnoses   Final diagnoses:  Gastroenteritis     Discharge Instructions     Return if any problems.    ED Prescriptions    Medication Sig Dispense Auth. Provider   diphenoxylate-atropine (LOMOTIL) 2.5-0.025 MG tablet Take 1 tablet by mouth 4 (four) times daily as needed for diarrhea or loose stools. 30 tablet [place] Areas, [place]     PDMP not reviewed this encounter.  An After Visit Summary was printed and given to the patient.    Elson Areas, [place] 08/03/20 [phone]

## 2020-08-03 NOTE — Discharge Instructions (Signed)
Return if any problems.

## 2020-11-27 IMAGING — CT CT OF THE LEFT KNEE WITHOUT CONTRAST
3 series · 14 of 33 positions shown, 17 images · non-contrast
Comparison: Radiograph earlier this day.

CLINICAL DATA: Left knee pain after injury, tibial plateau fracture
on radiograph.

EXAM:
CT OF THE LEFT KNEE WITHOUT CONTRAST
TECHNIQUE: Multidetector CT imaging of the LEFT knee was performed according to
the standard protocol. Multiplanar CT image reconstructions were
also generated.

[Series 7: axial st · axial · 0.38mm/px · z∈[+22,+170]mm · 6 of 194 slices shown, 8 images]
[im 30/194  soft-tissue]
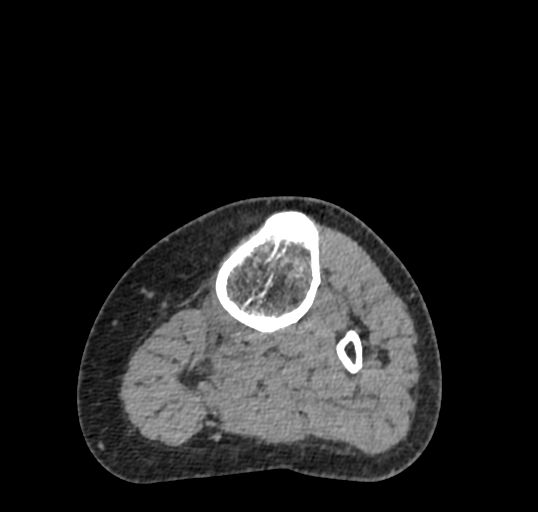
[im 30/194  bone]
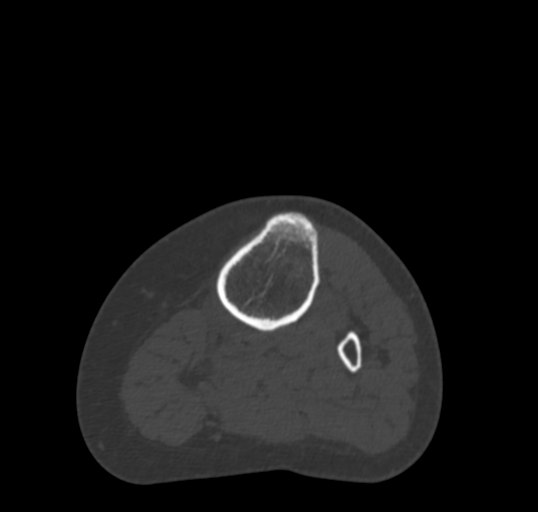
[im 60/194  bone]
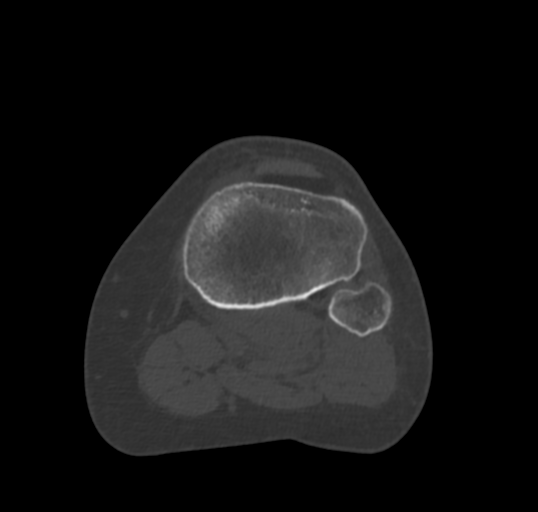
[im 90/194  bone]
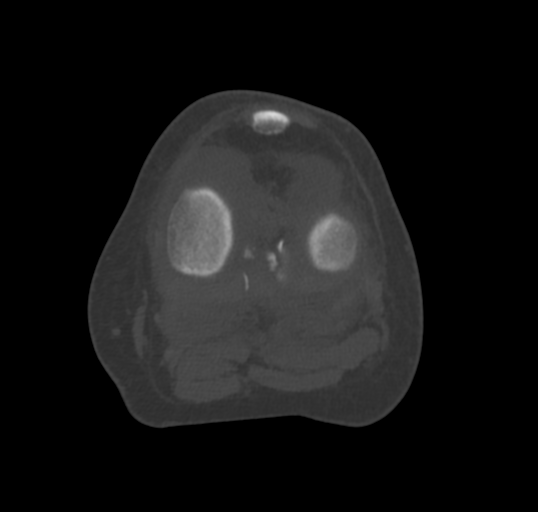
[im 119/194  bone]
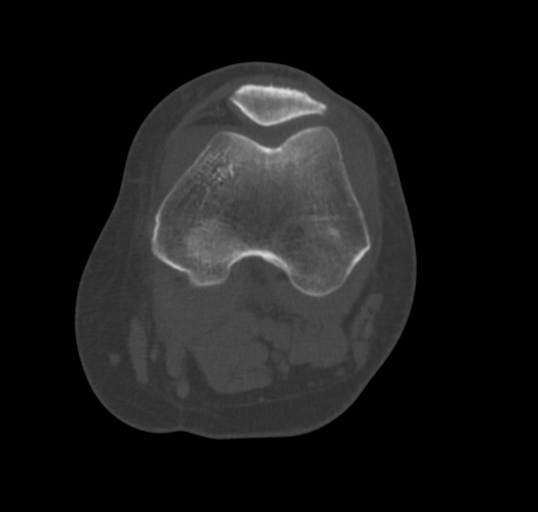
[im 149/194  soft-tissue]
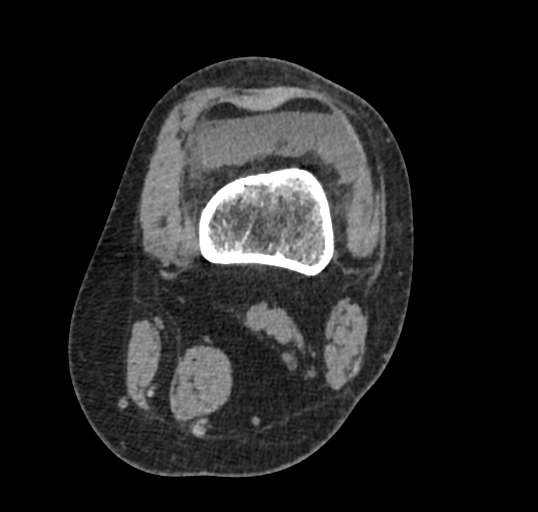
[im 149/194  bone]
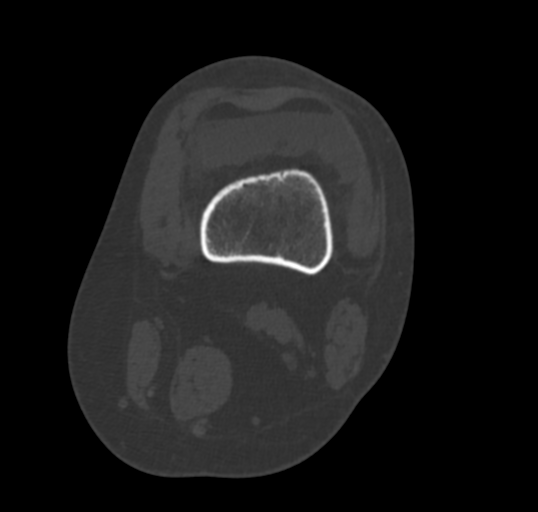
[im 179/194  bone]
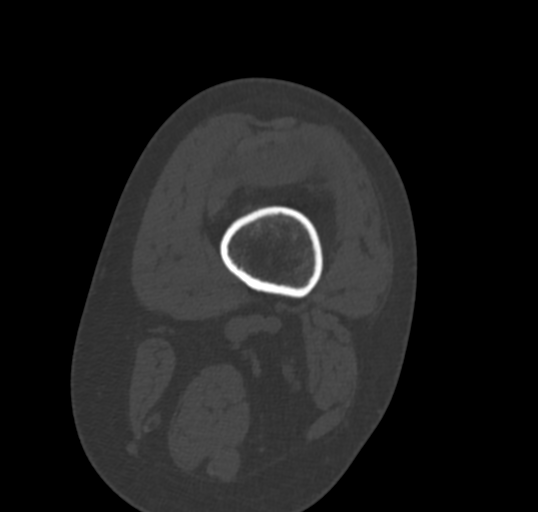

[Series 8: cor st · coronal · 0.37mm/px · 3 of 146 slices shown]
[im 30/146  bone]
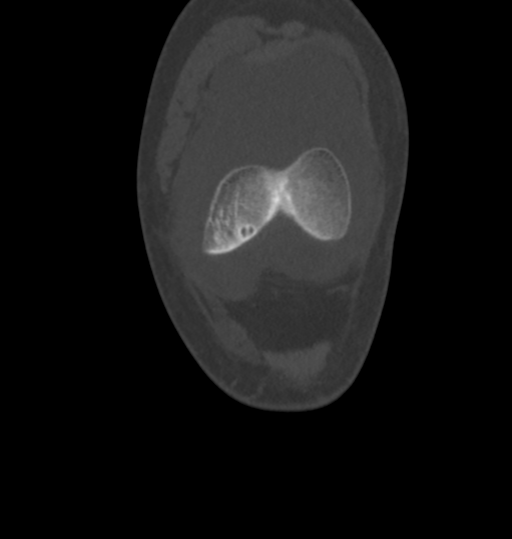
[im 59/146  bone]
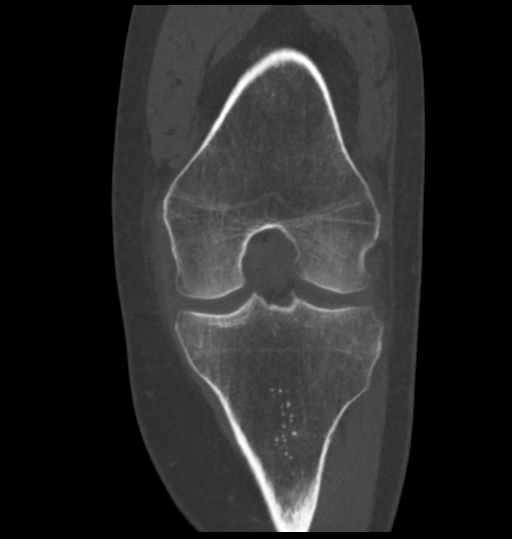
[im 88/146  bone]
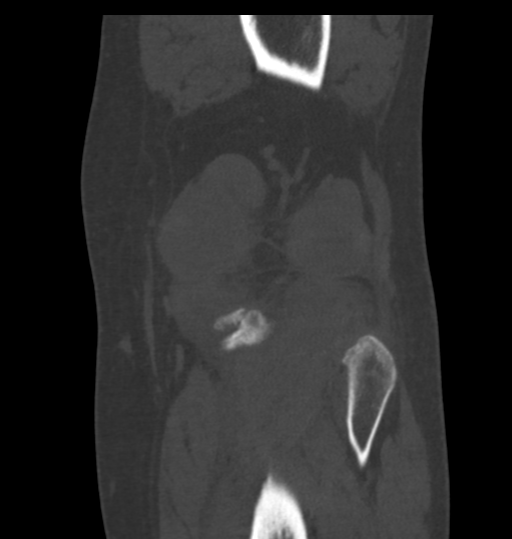

[Series 9: sag st · sagittal · 0.31mm/px · 5 of 134 slices shown, 6 images]
[im 45/134  bone]
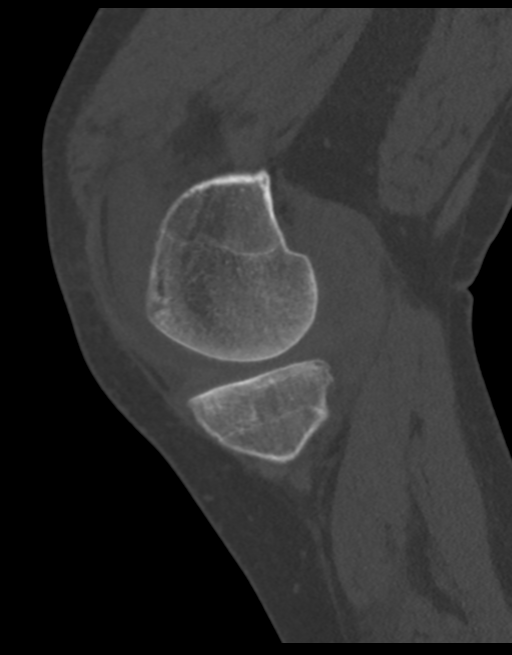
[im 56/134  bone]
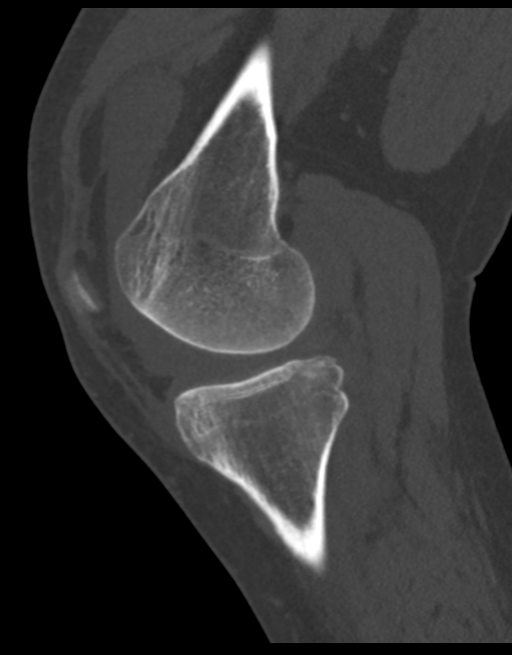
[im 67/134  soft-tissue]
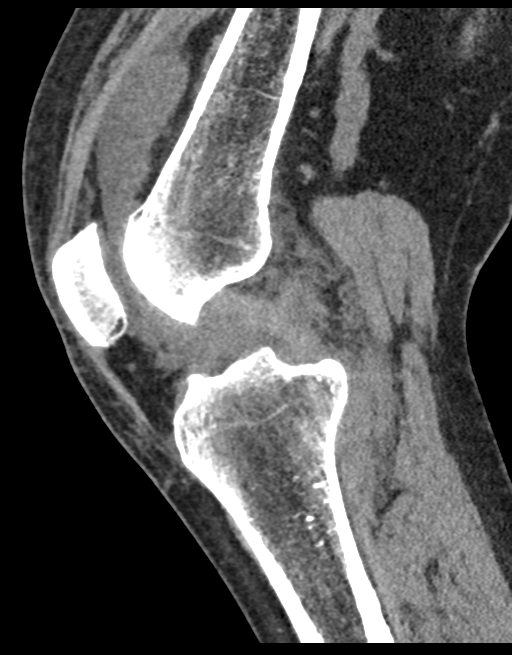
[im 67/134  bone]
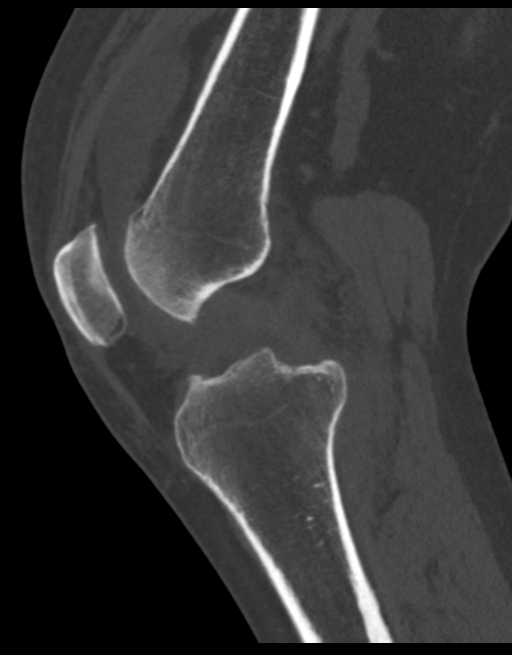
[im 78/134  bone]
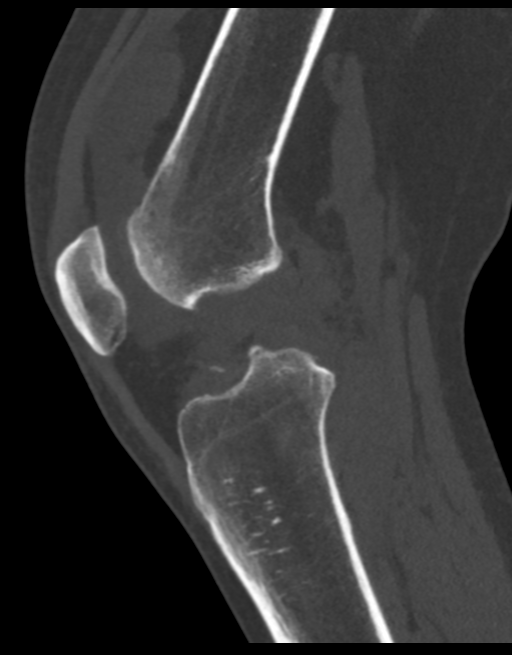
[im 89/134  bone]
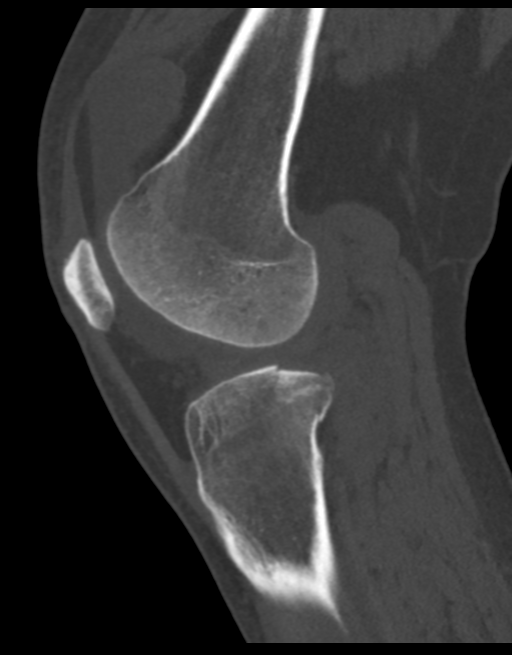

[14 of 33 positions shown; findings below may reference images not displayed]

FINDINGS: Bones/Joint/Cartilage

Comminuted displaced lateral tibial plateau fracture. There is 2 mm
articular surface depression. Fracture primarily involves the
posterior cortex, with the vertically oriented component
posteriorly. There are small fracture fragments involving the tibial
spine. No metaphyseal component. No associated fracture of the
proximal fibula. No fracture of the patella or distal femur.
Moderate lipohemarthrosis.

Ligaments

Suboptimally assessed by CT. ACL is indistinct and may be torn. PCL
fibers are visualized. There is edema about the femoral insertion of
the MCL.

Muscles and Tendons

Quadriceps and patellar tendons are intact. Mild edema in the
posterior calf musculature.

Soft tissues

Soft tissue edema about the knee.
IMPRESSION: 1. Comminuted lateral tibial plateau fracture with 2 mm articular
surface depression. Moderate lipohemarthrosis.
2. Small fracture fragments at the tibial spine and indistinct ACL,
suspicious for ACL tear and avulsion fractures.

## 2020-11-27 IMAGING — DX LEFT KNEE - COMPLETE 4+ VIEW
4 series · 4 of 4 positions shown · non-contrast
Comparison: None.

CLINICAL DATA: Left knee pain after injury today, fell from
forklift and got leg caught in strap Swelling and pain with limited
and painful ROM, unable to bear weightinjury

EXAM:
LEFT KNEE - COMPLETE 4+ VIEW

[knee ap]
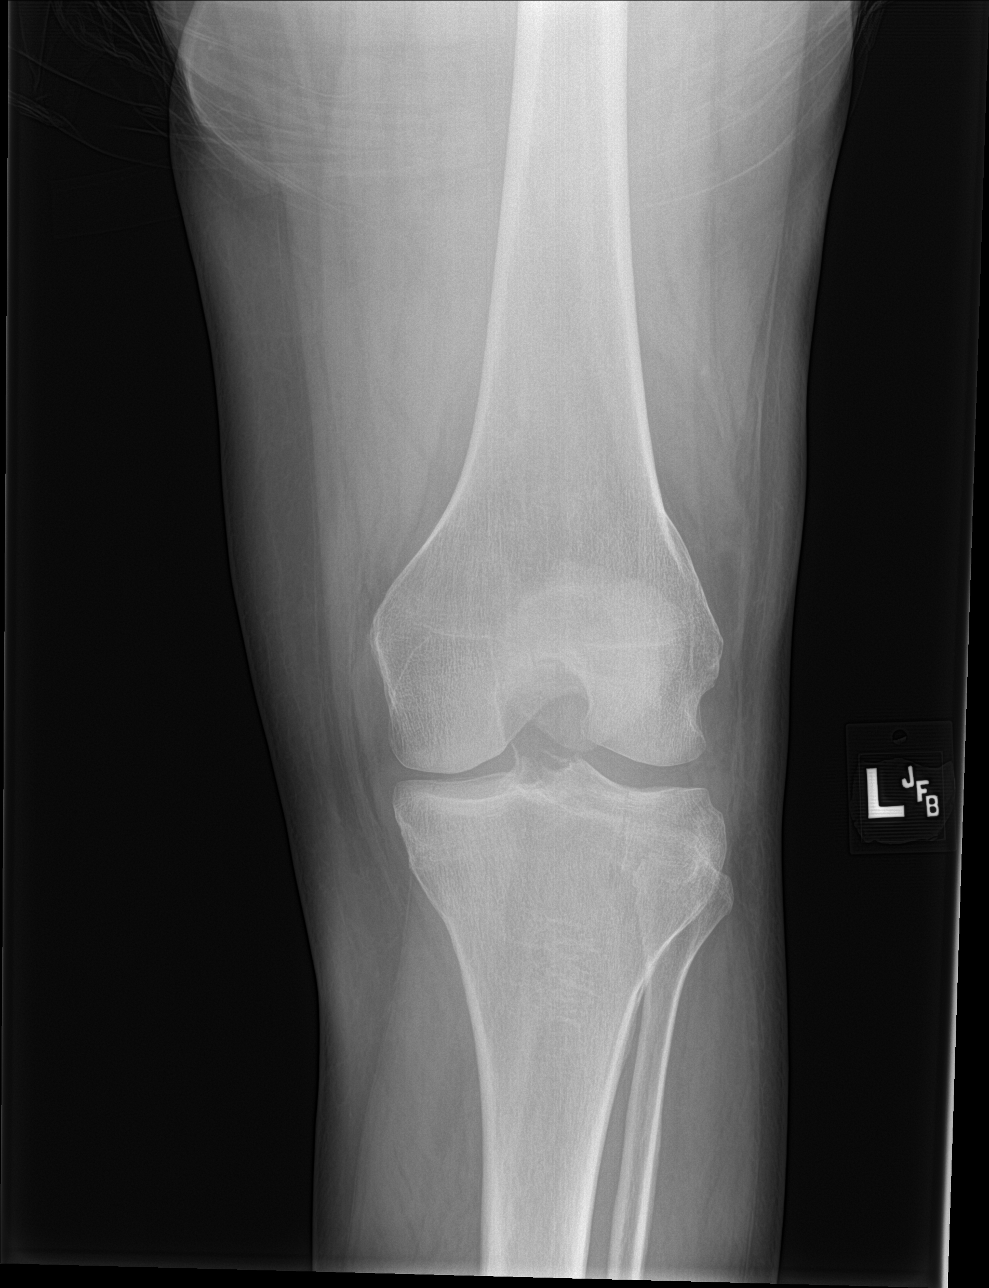

[knee lat]
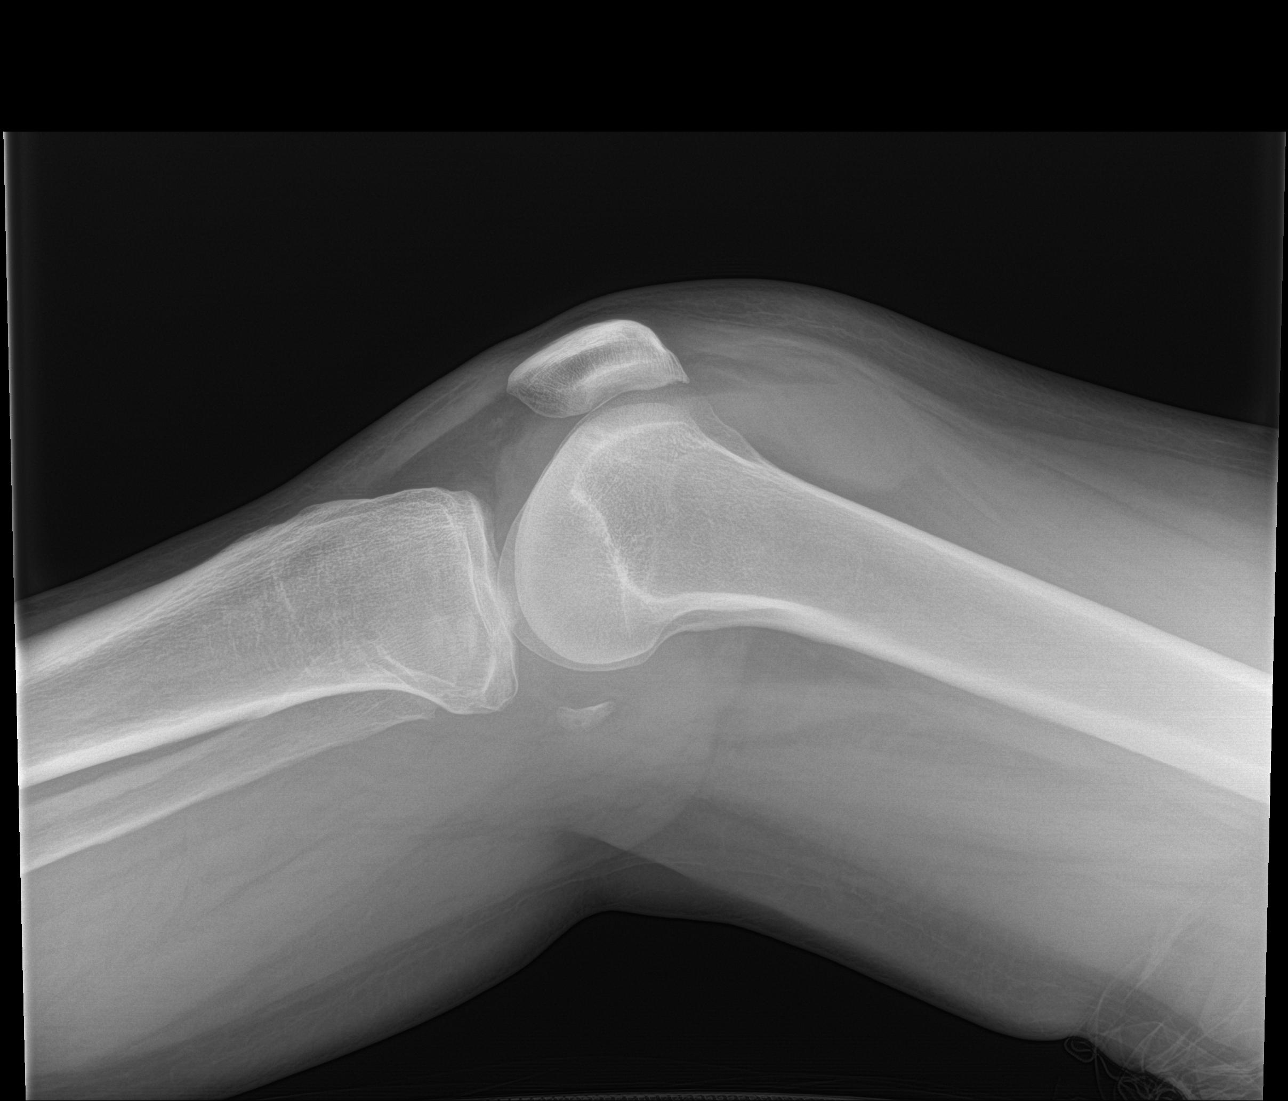

[knee obl (1 of 2)]
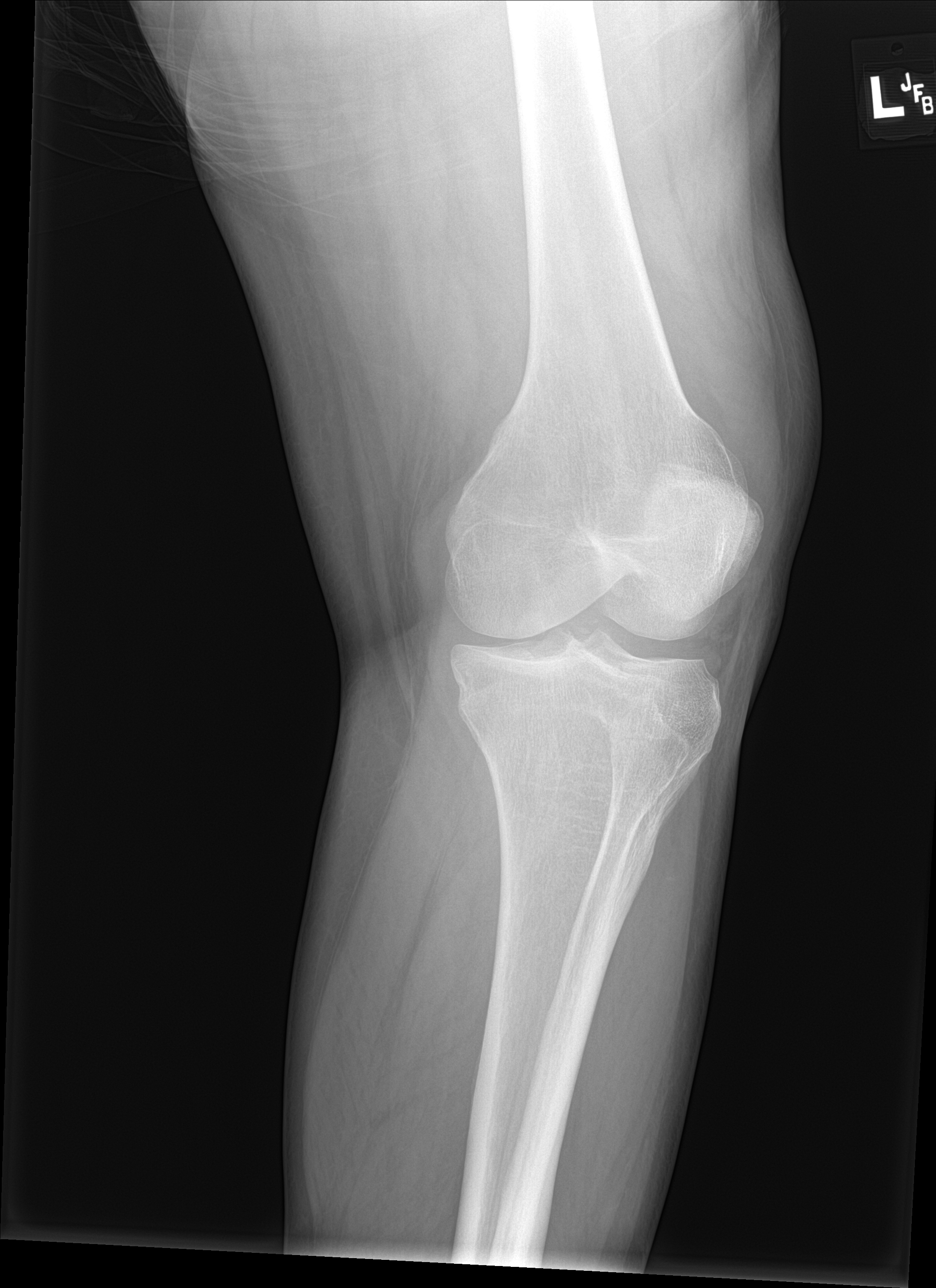

[knee obl (2 of 2)]
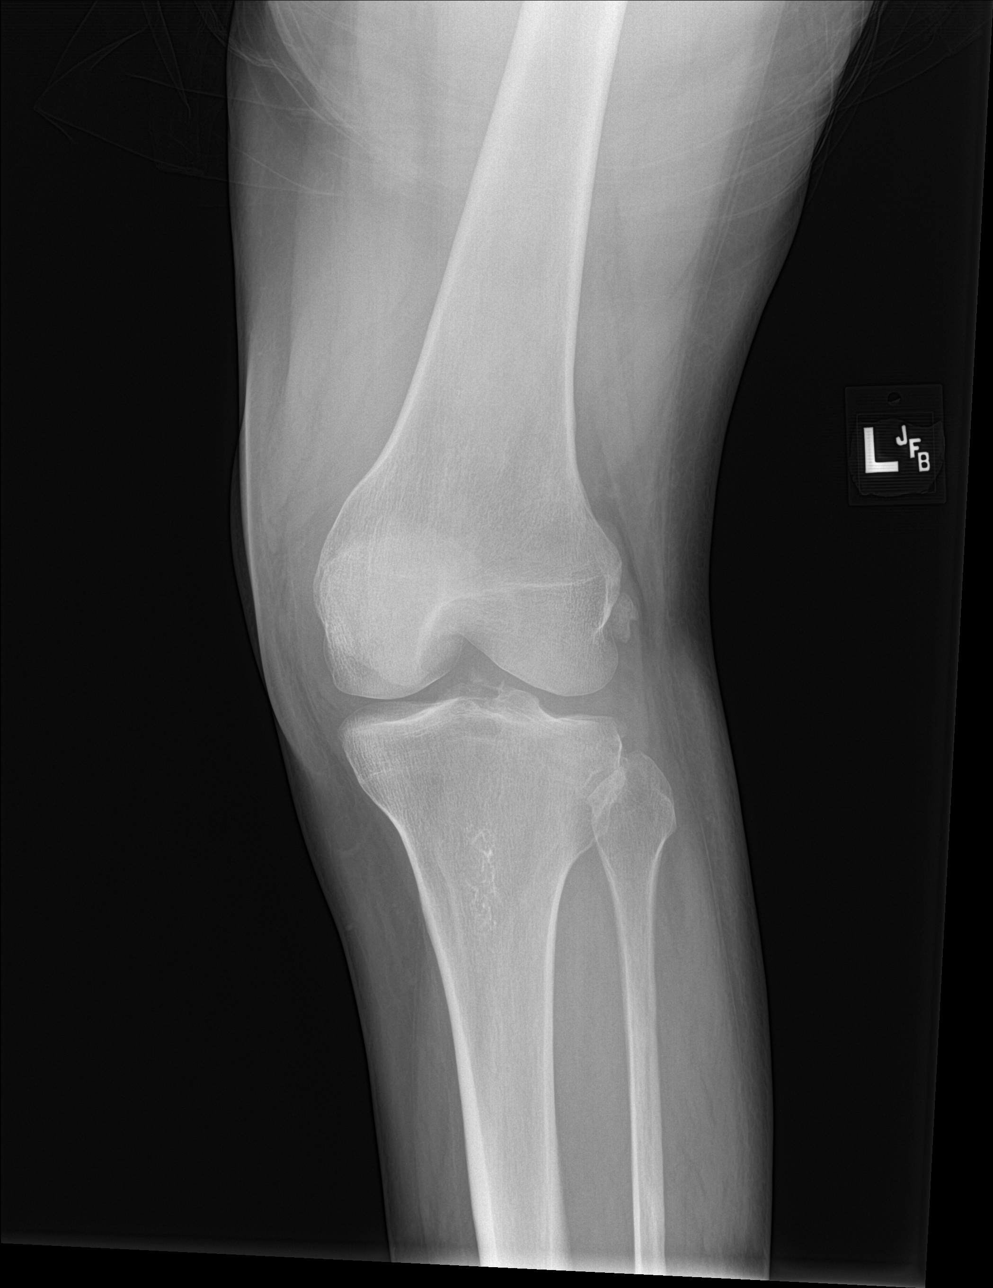

[4 of 4 positions shown; findings below may reference images not displayed]

FINDINGS: A vertical fracture through the lateral tibial plateau. Minimal
depression of the tibial plateau. Fracture of the lateral and medial
tibial spines

Lipohemarthrosis noted in the suprapatellar joint.
IMPRESSION: Lateral tibial plateau fracture.

## 2021-04-21 ENCOUNTER — Ambulatory Visit: Payer: Federal, State, Local not specified - PPO | Admitting: Orthopedic Surgery

## 2021-05-07 ENCOUNTER — Telehealth: Payer: Self-pay

## 2021-05-07 ENCOUNTER — Other Ambulatory Visit: Payer: Self-pay

## 2021-05-07 ENCOUNTER — Encounter: Payer: Self-pay | Admitting: Orthopedic Surgery

## 2021-05-07 ENCOUNTER — Ambulatory Visit (INDEPENDENT_AMBULATORY_CARE_PROVIDER_SITE_OTHER): Admitting: Orthopedic Surgery

## 2021-05-07 ENCOUNTER — Ambulatory Visit (INDEPENDENT_AMBULATORY_CARE_PROVIDER_SITE_OTHER)

## 2021-05-07 DIAGNOSIS — G8929 Other chronic pain: Secondary | ICD-10-CM

## 2021-05-07 DIAGNOSIS — M25562 Pain in left knee: Secondary | ICD-10-CM | POA: Diagnosis not present

## 2021-05-07 NOTE — Progress Notes (Signed)
Office Visit Note   Patient: Connie Zavala           Date of Birth: [birth year]           MRN: [id number] Visit Date: 05/07/2021 Requested by: Loura Pardon, PA [address] [place],  [place] [address] PCP: Loura Pardon, PA  Subjective: Chief Complaint  Patient presents with   Left Knee - Pain    HPI: Connie Zavala is a 56 year old patient with left knee pain.  Having some medial sided knee pain.  She underwent left knee ACL reconstruction with hamstring autograft 11/07/2018.  She has been diagnosed with type 2 diabetes since she was last seen.  She has gained about 70 pounds since her injury.  Tylenol has not been particularly helpful for pain.  She was told by another physician she should not take anti-inflammatories.              ROS: All systems reviewed are negative as they relate to the chief complaint within the history of present illness.  Patient denies  fevers or chills.   Assessment & Plan: Visit Diagnoses:  1. Chronic pain of left knee     Plan: Impression is left knee pain in the medial side with excellent stability and range of motion to the left knee.  No effusion.  I would like her to try getting that leg a little stronger by doing physical therapy 1-2 times a week for 1 to 6 weeks plus a home exercise program.  Forms are completed for handicap sticker as she cannot walk more than 200 feet without stopping.  She drives a forklift.  She may need FMLA occasionally because of her knee.  I will see her back in 3 months if not improved.  Follow-Up Instructions: No follow-ups on file.   Orders:  Orders Placed This Encounter  Procedures   XR KNEE 3 VIEW LEFT   Ambulatory referral to Physical Therapy   No orders of the defined types were placed in this encounter.     Procedures: No procedures performed   Clinical Data: No additional findings.  Objective: Vital Signs: There were no vitals taken for this visit.  Physical Exam:    Constitutional: Patient appears well-developed HEENT:  Head: Normocephalic Eyes:EOM are normal Neck: Normal range of motion Cardiovascular: Normal rate Pulmonary/chest: Effort normal Neurologic: Patient is alert Skin: Skin is warm Psychiatric: Patient has normal mood and affect   Ortho Exam: Ortho exam demonstrates full active and passive range of motion of the left knee.  Excellent graft ability to anterior drawer and Lachman testing.  Collaterals are stable.  Mild medial joint line tenderness.  No crepitus with active or passive range of motion of the knee.  No groin pain with internal/external rotation of the leg.  No masses lymphadenopathy or skin changes noted in that left knee region.  All incisions well-healed and nontender  Specialty Comments:  No specialty comments available.  Imaging: XR KNEE 3 VIEW LEFT  Result Date: 05/07/2021 AP lateral merchant radiographs left knee reviewed.  Evidence of prior ACL reconstruction is noted with hardware in the femur and tibia.  Tunnels appear appropriately positioned.  Mild medial joint space narrowing present.  No acute fracture.  Bone quality appears intact.  Patella height normal.    PMFS History: Patient Active Problem List   Diagnosis Date Noted   Asthma with acute exacerbation 12/08/2017   Seasonal allergic rhinitis due to pollen 07/06/2016   Mild persistent asthma  without complication 07/06/2016   History of hypertension 07/06/2016   Wheezing 03/19/2015   Other allergic rhinitis 03/19/2015   Equinus deformity of foot, acquired 02/18/2015   Metatarsalgia of both feet 02/14/2014   Metatarsal deformity 02/14/2014   Acquired bilateral pes cavus 02/14/2014   Past Medical History:  Diagnosis Date   Asthma    Diabetes (HCC)    GERD (gastroesophageal reflux disease)    Hypertension    Thyromegaly     Family History  Problem Relation Age of Onset   Stroke Mother    Heart attack Father     Past Surgical History:   Procedure Laterality Date   RFA procedure     Social History   Occupational History   Not on file  Tobacco Use   Smoking status: Never   Smokeless tobacco: Never  Vaping Use   Vaping Use: Never used  Substance and Sexual Activity   Alcohol use: Not Currently   Drug use: No   Sexual activity: Not on file

## 2021-05-07 NOTE — Telephone Encounter (Signed)
Auth needed for PT from [place] [facility] Also patient request for you to call her at [phone] needing help with a bill being submitted to [employer]

## 2021-10-08 ENCOUNTER — Ambulatory Visit: Payer: Federal, State, Local not specified - PPO | Admitting: Orthopedic Surgery

## 2021-10-10 ENCOUNTER — Encounter: Payer: Self-pay | Admitting: Orthopedic Surgery

## 2021-10-10 ENCOUNTER — Ambulatory Visit (INDEPENDENT_AMBULATORY_CARE_PROVIDER_SITE_OTHER): Admitting: Orthopedic Surgery

## 2021-10-10 DIAGNOSIS — G8929 Other chronic pain: Secondary | ICD-10-CM | POA: Diagnosis not present

## 2021-10-10 DIAGNOSIS — M25562 Pain in left knee: Secondary | ICD-10-CM | POA: Diagnosis not present

## 2021-10-10 DIAGNOSIS — S83512D Sprain of anterior cruciate ligament of left knee, subsequent encounter: Secondary | ICD-10-CM | POA: Diagnosis not present

## 2021-10-10 NOTE — Progress Notes (Signed)
Office Visit Note   Patient: Connie Zavala           Date of Birth: May 31, 1965           MRN: [id number] Visit Date: 10/10/2021 Requested by: Loura Pardon, PA [address] [place],  [place] [address] PCP: Loura Pardon, PA  Subjective: Chief Complaint  Patient presents with   Left Knee - Pain    HPI: Connie Zavala is a 56 year old patient with mild continued left knee pain and swelling.  She had ACL reconstruction 11/07/2018 with hamstring autograft.  She has been working as a Museum/gallery exhibitions officer.  She would like to be able to do some overtime.  Would need to have that paper filled out for her to do that.  That is a [place] [employer] form.              ROS: All systems reviewed are negative as they relate to the chief complaint within the history of present illness.  Patient denies  fevers or chills.   Assessment & Plan: Visit Diagnoses:  1. Chronic pain of left knee   2. Rupture of anterior cruciate ligament of left knee, subsequent encounter     Plan: Impression is left knee pain with very stable graft and no effusion.  Range of motion is excellent.  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  Continue to work on nonweightbearing quad strengthening exercises with a home exercise program.  We talked about physical therapy formally but she remembers enough of her exercises that she wants to try on her own.  Follow-up with [place] as needed.  Form filled out to create a window for her to pursue over time if so desired.  Follow-Up Instructions: Return if symptoms worsen or fail to improve.   Orders:  No orders of the defined types were placed in this encounter.  No orders of the defined types were placed in this encounter.     Procedures: No procedures performed   Clinical Data: No additional findings.  Objective: Vital Signs: There were no vitals taken for this visit.  Physical Exam:   Constitutional: Patient appears  well-developed HEENT:  Head: Normocephalic Eyes:EOM are normal Neck: Normal range of motion Cardiovascular: Normal rate Pulmonary/chest: Effort normal Neurologic: Patient is alert Skin: Skin is warm Psychiatric: Patient has normal mood and affect   Ortho Exam: Ortho exam demonstrates full range of motion of that left knee with full extension to 135 of flexion.  Collaterals are stable.  ACL PCL intact.  No masses lymphadenopathy or skin changes noted in that left knee region.  She has no effusion in the knee.  No patellofemoral crepitus.  About 1 cm atrophy left leg versus right in the quad region.  Specialty Comments:  No specialty comments available.  Imaging: No results found.   PMFS History: Patient Active Problem List   Diagnosis Date Noted   Asthma with acute exacerbation 12/08/2017   Seasonal allergic rhinitis due to pollen 07/06/2016   Mild persistent asthma without complication 07/06/2016   History of hypertension 07/06/2016   Wheezing 03/19/2015   Other allergic rhinitis 03/19/2015   Equinus deformity of foot, acquired 02/18/2015   Metatarsalgia of both feet 02/14/2014   Metatarsal deformity 02/14/2014   Acquired bilateral pes cavus 02/14/2014   Past Medical History:  Diagnosis Date   Asthma    Diabetes (HCC)    GERD (gastroesophageal reflux disease)  Hypertension    Thyromegaly     Family History  Problem Relation Age of Onset   Stroke Mother    Heart attack Father     Past Surgical History:  Procedure Laterality Date   RFA procedure     Social History   Occupational History   Not on file  Tobacco Use   Smoking status: Never   Smokeless tobacco: Never  Vaping Use   Vaping Use: Never used  Substance and Sexual Activity   Alcohol use: Not Currently   Drug use: No   Sexual activity: Not on file

## 2021-11-15 LAB — COLOGUARD: COLOGUARD: NEGATIVE

## 2021-11-15 LAB — EXTERNAL GENERIC LAB PROCEDURE: COLOGUARD: NEGATIVE

## 2022-12-07 ENCOUNTER — Emergency Department (HOSPITAL_BASED_OUTPATIENT_CLINIC_OR_DEPARTMENT_OTHER): Payer: Federal, State, Local not specified - PPO

## 2022-12-07 ENCOUNTER — Other Ambulatory Visit: Payer: Self-pay

## 2022-12-07 ENCOUNTER — Encounter (HOSPITAL_BASED_OUTPATIENT_CLINIC_OR_DEPARTMENT_OTHER): Payer: Self-pay | Admitting: Emergency Medicine

## 2022-12-07 ENCOUNTER — Emergency Department (HOSPITAL_BASED_OUTPATIENT_CLINIC_OR_DEPARTMENT_OTHER)
Admission: EM | Admit: 2022-12-07 | Discharge: 2022-12-07 | Disposition: A | Discharge status: Home / Self Care | Attending: Emergency Medicine | Admitting: Emergency Medicine

## 2022-12-07 DIAGNOSIS — R519 Headache, unspecified: Secondary | ICD-10-CM | POA: Diagnosis present

## 2022-12-07 DIAGNOSIS — E119 Type 2 diabetes mellitus without complications: Secondary | ICD-10-CM | POA: Diagnosis not present

## 2022-12-07 DIAGNOSIS — I10 Essential (primary) hypertension: Secondary | ICD-10-CM | POA: Insufficient documentation

## 2022-12-07 DIAGNOSIS — Z79899 Other long term (current) drug therapy: Secondary | ICD-10-CM | POA: Diagnosis not present

## 2022-12-07 DIAGNOSIS — Z7984 Long term (current) use of oral hypoglycemic drugs: Secondary | ICD-10-CM | POA: Insufficient documentation

## 2022-12-07 LAB — COMPREHENSIVE METABOLIC PANEL
ALT: 17 U/L (ref 0–44)
AST: 26 U/L (ref 15–41)
Albumin: 3.9 g/dL (ref 3.5–5.0)
Alkaline Phosphatase: 68 U/L (ref 38–126)
Anion gap: 11 (ref 5–15)
BUN: 13 mg/dL (ref 6–20)
CO2: 26 mmol/L (ref 22–32)
Calcium: 9 mg/dL (ref 8.9–10.3)
Chloride: 100 mmol/L (ref 98–111)
Creatinine, Ser: 0.83 mg/dL (ref 0.44–1.00)
GFR, Estimated: >60 mL/min (ref >60)
Glucose, Bld: 97 mg/dL (ref 70–99)
Potassium: 4 mmol/L (ref 3.5–5.1)
Sodium: 137 mmol/L (ref 135–145)
Total Bilirubin: 0.7 mg/dL (ref 0.3–1.2)
Total Protein: 7.8 g/dL (ref 6.5–8.1)

## 2022-12-07 LAB — CBC WITH DIFFERENTIAL/PLATELET
Abs Immature Granulocytes: 0.02 10*3/uL (ref 0.00–0.07)
Basophils Absolute: 0 10*3/uL (ref 0.0–0.1)
Basophils Relative: 1 %
Eosinophils Absolute: 0.1 10*3/uL (ref 0.0–0.5)
Eosinophils Relative: 1 %
HCT: 40.7 % (ref 36.0–46.0)
Hemoglobin: 13.2 g/dL (ref 12.0–15.0)
Immature Granulocytes: 0 %
Lymphocytes Relative: 37 %
Lymphs Abs: 2.3 10*3/uL (ref 0.7–4.0)
MCH: 28.6 pg (ref 26.0–34.0)
MCHC: 32.4 g/dL (ref 30.0–36.0)
MCV: 88.1 fL (ref 80.0–100.0)
Monocytes Absolute: 0.5 10*3/uL (ref 0.1–1.0)
Monocytes Relative: 8 %
Neutro Abs: 3.4 10*3/uL (ref 1.7–7.7)
Neutrophils Relative %: 53 %
Platelets: 315 10*3/uL (ref 150–400)
RBC: 4.62 MIL/uL (ref 3.87–5.11)
RDW: 13.4 % (ref 11.5–15.5)
WBC: 6.3 10*3/uL (ref 4.0–10.5)
nRBC: 0 % (ref 0.0–0.2)

## 2022-12-07 MED ORDER — DIPHENHYDRAMINE HCL 50 MG/ML IJ SOLN: 25.0000 mg | Freq: Once | INTRAMUSCULAR | Status: DC

## 2022-12-07 MED ORDER — METOCLOPRAMIDE HCL 5 MG/ML IJ SOLN
10.0000 mg | Freq: Once | INTRAMUSCULAR | Status: AC
  Administered 2022-12-07: 10 mg via INTRAVENOUS
  Filled 2022-12-07: qty 2

## 2022-12-07 MED ORDER — SODIUM CHLORIDE 0.9 % IV BOLUS
1000.0000 mL | Freq: Once | INTRAVENOUS | Status: AC
  Administered 2022-12-07: 1000 mL via INTRAVENOUS

## 2022-12-07 MED ORDER — DIPHENHYDRAMINE HCL 50 MG/ML IJ SOLN
12.5000 mg | Freq: Once | INTRAMUSCULAR | Status: AC
  Administered 2022-12-07: 12.5 mg via INTRAVENOUS
  Filled 2022-12-07: qty 1

## 2022-12-07 MED ORDER — FENTANYL CITRATE PF 50 MCG/ML IJ SOSY: 50.0000 ug | PREFILLED_SYRINGE | Freq: Once | INTRAMUSCULAR | Status: DC

## 2022-12-07 NOTE — ED Notes (Signed)
Patient transported to CT 
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
## 2022-12-07 NOTE — Discharge Instructions (Signed)
As we discussed, your CT scan and labs were unremarkable today.  Please continue taking your current migraine meds as prescribed by your doctor  Please follow-up with your doctor at [facility].  Return to ER if you have worse headache or vomiting or fever or neck pain
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
## 2023-10-27 ENCOUNTER — Telehealth: Payer: Self-pay | Admitting: Orthopedic Surgery

## 2023-10-27 NOTE — Telephone Encounter (Signed)
 Pt submitted 2 medical release forms, FMLA and short term forms, and $40.00 payment

## 2023-10-27 NOTE — Telephone Encounter (Signed)
 Last seen 10/02/2021. New appt scheduled for 8/27. Holding forms until seen.

## 2023-10-29 ENCOUNTER — Telehealth: Payer: Self-pay | Admitting: Orthopedic Surgery

## 2023-10-29 NOTE — Telephone Encounter (Signed)
 Appointment canceled as we do not take [employer]. Forms placed at front desk to patient to pick up. Tisha advised pt of this.

## 2023-11-03 ENCOUNTER — Telehealth: Payer: Self-pay | Admitting: Orthopedic Surgery

## 2023-11-03 NOTE — Telephone Encounter (Signed)
 Pt made an payment of $40.00 FMLA and short term form payment for [facility] [employer]. We no longer take [facility] [employer]. Pt need a refund of $40.00. Pt was advised by April B will have to contact [facility] Acc/ Billing Department for refund. Billing advised pt to come her and ger refund and also they do not see the amount on her account. Sari asked to send so we can contact ipay for refund. Please call pt at [phone].

## 2023-11-10 ENCOUNTER — Ambulatory Visit: Admitting: Orthopedic Surgery

## 2023-11-26 ENCOUNTER — Telehealth: Payer: Self-pay | Admitting: Radiology

## 2023-11-26 NOTE — Telephone Encounter (Signed)
 I called, NA noVM set up.  $40 payment for forms has been credited to patient's card.  Mailing receipt to patient.

## 2024-01-17 ENCOUNTER — Encounter: Payer: Self-pay | Admitting: Radiology
